# Patient Record
Sex: Female | Born: 1985
Health system: Southern US, Community
[De-identification: ages and names within clinical notes are randomized; demographics above are authoritative.]

## PROBLEM LIST (undated history)

## (undated) DIAGNOSIS — N289 Disorder of kidney and ureter, unspecified: Secondary | ICD-10-CM

## (undated) DIAGNOSIS — F909 Attention-deficit hyperactivity disorder, unspecified type: Secondary | ICD-10-CM

## (undated) HISTORY — PX: TONSILLECTOMY: SUR1361

---

## 2007-10-03 ENCOUNTER — Emergency Department (HOSPITAL_COMMUNITY): Admission: EM | Admit: 2007-10-03 | Discharge: 2007-10-03 | Payer: Self-pay | Admitting: Emergency Medicine

## 2008-08-05 ENCOUNTER — Emergency Department (HOSPITAL_COMMUNITY): Admission: EM | Admit: 2008-08-05 | Discharge: 2008-08-06 | Payer: Self-pay | Admitting: Emergency Medicine

## 2010-04-17 LAB — URINE CULTURE

## 2010-04-17 LAB — DIFFERENTIAL
Eosinophils Relative: 2 % (ref 0–5)
Lymphocytes Relative: 38 % (ref 12–46)
Monocytes Absolute: 0.8 10*3/uL (ref 0.1–1.0)
Monocytes Relative: 7 % (ref 3–12)

## 2010-04-17 LAB — CBC
HCT: 38.3 % (ref 36.0–46.0)
Hemoglobin: 13.3 g/dL (ref 12.0–15.0)
MCHC: 34.7 g/dL (ref 30.0–36.0)
RBC: 4.22 MIL/uL (ref 3.87–5.11)
RDW: 13.1 % (ref 11.5–15.5)

## 2010-04-17 LAB — URINALYSIS, ROUTINE W REFLEX MICROSCOPIC
Glucose, UA: 100 mg/dL — AB
Hgb urine dipstick: NEGATIVE
Ketones, ur: NEGATIVE mg/dL
Protein, ur: NEGATIVE mg/dL

## 2010-04-17 LAB — BASIC METABOLIC PANEL
CO2: 29 mEq/L (ref 19–32)
GFR calc Af Amer: >60 mL/min (ref >60)
GFR calc non Af Amer: 53 mL/min — ABNORMAL LOW (ref >60)
Glucose, Bld: 76 mg/dL (ref 70–99)
Potassium: 4 mEq/L (ref 3.5–5.1)
Sodium: 140 mEq/L (ref 135–145)

## 2014-07-04 ENCOUNTER — Emergency Department (HOSPITAL_COMMUNITY): Payer: PRIVATE HEALTH INSURANCE

## 2014-07-04 ENCOUNTER — Encounter (HOSPITAL_COMMUNITY): Payer: Self-pay | Admitting: Emergency Medicine

## 2014-07-04 ENCOUNTER — Emergency Department (HOSPITAL_COMMUNITY)
Admission: EM | Admit: 2014-07-04 | Discharge: 2014-07-04 | Disposition: A | Discharge status: Home / Self Care | Payer: PRIVATE HEALTH INSURANCE | Attending: Emergency Medicine | Admitting: Emergency Medicine

## 2014-07-04 DIAGNOSIS — S0993XA Unspecified injury of face, initial encounter: Secondary | ICD-10-CM | POA: Diagnosis present

## 2014-07-04 DIAGNOSIS — Z880 Allergy status to penicillin: Secondary | ICD-10-CM | POA: Insufficient documentation

## 2014-07-04 DIAGNOSIS — Z8659 Personal history of other mental and behavioral disorders: Secondary | ICD-10-CM | POA: Insufficient documentation

## 2014-07-04 DIAGNOSIS — S0033XA Contusion of nose, initial encounter: Secondary | ICD-10-CM | POA: Diagnosis not present

## 2014-07-04 DIAGNOSIS — W51XXXA Accidental striking against or bumped into by another person, initial encounter: Secondary | ICD-10-CM | POA: Insufficient documentation

## 2014-07-04 DIAGNOSIS — Y9389 Activity, other specified: Secondary | ICD-10-CM | POA: Diagnosis not present

## 2014-07-04 DIAGNOSIS — Y990 Civilian activity done for income or pay: Secondary | ICD-10-CM | POA: Insufficient documentation

## 2014-07-04 DIAGNOSIS — Z87448 Personal history of other diseases of urinary system: Secondary | ICD-10-CM | POA: Insufficient documentation

## 2014-07-04 DIAGNOSIS — Y9289 Other specified places as the place of occurrence of the external cause: Secondary | ICD-10-CM | POA: Diagnosis not present

## 2014-07-04 DIAGNOSIS — Z720 Tobacco use: Secondary | ICD-10-CM | POA: Insufficient documentation

## 2014-07-04 HISTORY — DX: Disorder of kidney and ureter, unspecified: N28.9

## 2014-07-04 HISTORY — DX: Attention-deficit hyperactivity disorder, unspecified type: F90.9

## 2014-07-04 MED ORDER — TRAMADOL HCL 50 MG PO TABS: 50.0000 mg | ORAL_TABLET | Freq: Four times a day (QID) | ORAL | Status: DC | PRN

## 2014-07-04 NOTE — ED Provider Notes (Signed)
CSN: [id number]     Arrival date & time 07/04/14  1421 History   First MD Initiated Contact with Patient 07/04/14 1450     Chief Complaint  Patient presents with  . Facial Injury     (Consider location/radiation/quality/duration/timing/severity/associated sxs/prior Treatment) The history is provided by the patient.   LORILEI BUFORD is a 29 y.o. female presenting for evaluation of nose injury.  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  She has had persistent aching pain despite ice packs (which has improved the swelling) and endorses transient nasal congestion with forward flexion but also states this may be her allergies causing this.  She has had no nose bleeds and denies any radiation of pain, no dizziness, bruising,  vision changes or other complaint.     Past Medical History  Diagnosis Date  . Renal disorder   . ADHD (attention deficit hyperactivity disorder)    Past Surgical History  Procedure Laterality Date  . Tonsillectomy     No family history on file. History  Substance Use Topics  . Smoking status: Current Some Day Smoker  . Smokeless tobacco: Not on file  . Alcohol Use: Yes     Comment: 2 beers per month   OB History    No data available     Review of Systems  Constitutional: Negative for fever and chills.  HENT: Positive for facial swelling. Negative for nosebleeds, postnasal drip, rhinorrhea and sore throat.   Eyes: Negative.   Respiratory: Negative.   Cardiovascular: Negative.   Gastrointestinal: Negative for nausea and vomiting.  Genitourinary: Negative.   Musculoskeletal: Negative for neck pain.  Skin: Negative.  Negative for rash and wound.  Neurological: Negative for dizziness, weakness, light-headedness, numbness and headaches.  Psychiatric/Behavioral: Negative.       Allergies  Biaxin; Penicillins; and Zyrtec  Home Medications    Prior to Admission medications   Medication Sig Start Date End Date Taking? Authorizing Provider  traMADol (ULTRAM) 50 MG tablet Take 1 tablet (50 mg total) by mouth every 6 (six) hours as needed. 07/04/14   Burgess Amor, PA-C   BP 115/91 mmHg  Pulse 98  Temp(Src) 98.4 F (36.9 C) (Oral)  Resp 18  Ht 5\' 1"  (1.549 m)  Wt 130 lb (58.968 kg)  BMI 24.58 kg/m2  SpO2 100%  LMP 06/13/2014 Physical Exam  Constitutional: She appears well-developed and well-nourished. No distress.  HENT:  Head: Normocephalic and atraumatic.  Right Ear: Hearing and external ear normal.  Left Ear: Hearing and external ear normal.  Nose: Sinus tenderness present. No mucosal edema, rhinorrhea, nasal deformity, septal deviation or nasal septal hematoma. No epistaxis.  ttp across nasal bridge, no deformity or edema.  Eyes: Conjunctivae are normal.  Neck: Normal range of motion.  Cardiovascular: Normal rate, regular rhythm, normal heart sounds and intact distal pulses.   Pulmonary/Chest: Effort normal and breath sounds normal. She has no wheezes.  Abdominal: Soft. Bowel sounds are normal. There is no tenderness.  Musculoskeletal: Normal range of motion.  Neurological: She is alert.  Skin: Skin is warm and dry.  Psychiatric: She has a normal mood and affect.  Nursing note and vitals reviewed.   ED Course  Procedures (including critical care time) Labs Review Labs Reviewed - No data to display  Imaging Review Dg Nasal Bones  07/04/2014   CLINICAL DATA:  elbowed by patient yesterday. Pain  in swelling at bridge of nose.  EXAM: NASAL BONES - 3+ VIEW  COMPARISON:  None.  FINDINGS: There is no evidence of fracture or other bone abnormality.  IMPRESSION: Negative.   Electronically Signed   By: Signa Kell M.D.   On: 07/04/2014 15:20     EKG Interpretation None      MDM   Final diagnoses:  Nasal contusion, initial encounter    Patients labs and/or radiological studies were reviewed and considered  during the medical decision making and disposition process.  Results were also discussed with patient.  Pt was given reassurance that symptoms should improve with time.  May continue using ice therapy.  She was prescribed tramadol if needed for additional pain relief.  She endorses a history of colitis so NSAIDs are not  recommended. when necessary follow-up if symptoms persist beyond the next week.    Burgess Amor, PA-C 07/04/14 1629  Eber Hong, MD 07/04/14 [phone]

## 2014-07-04 NOTE — Discharge Instructions (Signed)
Contusion °A contusion is a deep bruise. Contusions are the result of an injury that caused bleeding under the skin. The contusion may turn blue, purple, or yellow. Minor injuries will give you a painless contusion, but more severe contusions may stay painful and swollen for a few weeks.  °CAUSES  °A contusion is usually caused by a blow, trauma, or direct force to an area of the body. °SYMPTOMS  °· Swelling and redness of the injured area. °· Bruising of the injured area. °· Tenderness and soreness of the injured area. °· Pain. °DIAGNOSIS  °The diagnosis can be made by taking a history and physical exam. An X-ray, CT scan, or MRI may be needed to determine if there were any associated injuries, such as fractures. °TREATMENT  °Specific treatment will depend on what area of the body was injured. In general, the best treatment for a contusion is resting, icing, elevating, and applying cold compresses to the injured area. Over-the-counter medicines may also be recommended for pain control. Ask your caregiver what the best treatment is for your contusion. °HOME CARE INSTRUCTIONS  °· Put ice on the injured area. °¨ Put ice in a plastic bag. °¨ Place a towel between your skin and the bag. °¨ Leave the ice on for 15-20 minutes, 3-4 times a day, or as directed by your health care provider. °· Only take over-the-counter or prescription medicines for pain, discomfort, or fever as directed by your caregiver. Your caregiver may recommend avoiding anti-inflammatory medicines (aspirin, ibuprofen, and naproxen) for 48 hours because these medicines may increase bruising. °· Rest the injured area. °· If possible, elevate the injured area to reduce swelling. °SEEK IMMEDIATE MEDICAL CARE IF:  °· You have increased bruising or swelling. °· You have pain that is getting worse. °· Your swelling or pain is not relieved with medicines. °MAKE SURE YOU:  °· Understand these instructions. °· Will watch your condition. °· Will get help right  away if you are not doing well or get worse. °Document Released: 10/05/2004 Document Revised: 12/31/2012 Document Reviewed: 10/31/2010 °ExitCare® Patient Information ©2015 [employer]. This information is not intended to replace advice given to you by your health care provider. Make sure you discuss any questions you have with your health care provider. ° °

## 2014-07-04 NOTE — ED Notes (Signed)
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 Denies any bleeding but reports pain. Patient is here under workers comp.

## 2015-04-12 DIAGNOSIS — F908 Attention-deficit hyperactivity disorder, other type: Secondary | ICD-10-CM | POA: Diagnosis not present

## 2015-04-12 DIAGNOSIS — B373 Candidiasis of vulva and vagina: Secondary | ICD-10-CM | POA: Diagnosis not present

## 2015-04-12 DIAGNOSIS — J019 Acute sinusitis, unspecified: Secondary | ICD-10-CM | POA: Diagnosis not present

## 2015-04-12 DIAGNOSIS — R079 Chest pain, unspecified: Secondary | ICD-10-CM | POA: Diagnosis not present

## 2015-04-27 DIAGNOSIS — E663 Overweight: Secondary | ICD-10-CM | POA: Diagnosis not present

## 2015-04-27 DIAGNOSIS — Z01419 Encounter for gynecological examination (general) (routine) without abnormal findings: Secondary | ICD-10-CM | POA: Diagnosis not present

## 2015-04-27 DIAGNOSIS — R5383 Other fatigue: Secondary | ICD-10-CM | POA: Diagnosis not present

## 2015-04-27 DIAGNOSIS — Z789 Other specified health status: Secondary | ICD-10-CM | POA: Diagnosis not present

## 2015-05-28 DIAGNOSIS — R5383 Other fatigue: Secondary | ICD-10-CM | POA: Diagnosis not present

## 2015-06-03 DIAGNOSIS — R5383 Other fatigue: Secondary | ICD-10-CM | POA: Diagnosis not present

## 2015-06-10 DIAGNOSIS — R5383 Other fatigue: Secondary | ICD-10-CM | POA: Diagnosis not present

## 2015-06-20 ENCOUNTER — Emergency Department (HOSPITAL_COMMUNITY)
Admission: EM | Admit: 2015-06-20 | Discharge: 2015-06-20 | Disposition: A | Discharge status: Home / Self Care | Payer: 59 | Attending: Emergency Medicine | Admitting: Emergency Medicine

## 2015-06-20 ENCOUNTER — Emergency Department (HOSPITAL_COMMUNITY): Payer: 59

## 2015-06-20 ENCOUNTER — Encounter (HOSPITAL_COMMUNITY): Payer: Self-pay | Admitting: *Deleted

## 2015-06-20 DIAGNOSIS — Z79899 Other long term (current) drug therapy: Secondary | ICD-10-CM | POA: Diagnosis not present

## 2015-06-20 DIAGNOSIS — Z791 Long term (current) use of non-steroidal anti-inflammatories (NSAID): Secondary | ICD-10-CM | POA: Diagnosis not present

## 2015-06-20 DIAGNOSIS — F909 Attention-deficit hyperactivity disorder, unspecified type: Secondary | ICD-10-CM | POA: Insufficient documentation

## 2015-06-20 DIAGNOSIS — F17200 Nicotine dependence, unspecified, uncomplicated: Secondary | ICD-10-CM | POA: Insufficient documentation

## 2015-06-20 DIAGNOSIS — M25511 Pain in right shoulder: Secondary | ICD-10-CM | POA: Diagnosis not present

## 2015-06-20 DIAGNOSIS — R918 Other nonspecific abnormal finding of lung field: Secondary | ICD-10-CM | POA: Diagnosis not present

## 2015-06-20 LAB — CBC WITH DIFFERENTIAL/PLATELET
BASOS ABS: 0 10*3/uL (ref 0.0–0.1)
Basophils Relative: 1 %
EOS ABS: 0.2 10*3/uL (ref 0.0–0.7)
EOS PCT: 2 %
HCT: 42.2 % (ref 36.0–46.0)
Hemoglobin: 13.8 g/dL (ref 12.0–15.0)
LYMPHS PCT: 30 %
Lymphs Abs: 2.3 10*3/uL (ref 0.7–4.0)
MCH: 31.2 pg (ref 26.0–34.0)
MCHC: 32.7 g/dL (ref 30.0–36.0)
MCV: 95.3 fL (ref 78.0–100.0)
MONO ABS: 0.6 10*3/uL (ref 0.1–1.0)
Monocytes Relative: 8 %
Neutro Abs: 4.7 10*3/uL (ref 1.7–7.7)
Neutrophils Relative %: 59 %
PLATELETS: 288 10*3/uL (ref 150–400)
RBC: 4.43 MIL/uL (ref 3.87–5.11)
RDW: 13.3 % (ref 11.5–15.5)
WBC: 7.8 10*3/uL (ref 4.0–10.5)

## 2015-06-20 NOTE — ED Notes (Signed)
Pt c/o right "shoulder joint" pain x 2 days that radiates down into her elbow and worsens when she tries to lift her upper arm. Pt has been receiving Vitamin B12 shots and started having this pain after her injection on Friday. Pt reports she had immediate pain at the site when she was injected Friday. Pt does report tingling sensation in all extremities due to low Vitamin B12 for 2-3 months. Pt denies any injury to the shoulder.

## 2015-06-20 NOTE — ED Provider Notes (Signed)
CSN: [id number]     Arrival date & time 06/20/15  1423 History   First MD Initiated Contact with Patient 06/20/15 1451     Chief Complaint  Patient presents with  . Shoulder Pain     (Consider location/radiation/quality/duration/timing/severity/associated sxs/prior Treatment) Patient is a 30 y.o. female presenting with shoulder pain.  Shoulder Pain  30 year old female comes today complaining of right arm pain. She states that she received a B-12 injection in her right shoulder for 2 days ago. She has had these in the past. This time she had immediate pain in the site and she was injected. She has had ongoing tenderness in the lateral aspect of her right shoulder. There are some increased pain with movement. She has not noted any redness, warmth, or fever. Past Medical History  Diagnosis Date  . ADHD (attention deficit hyperactivity disorder)   . 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 Tonsillectomy     No family history on file. Social History  Substance Use Topics  . Smoking status: Current Some Day Smoker  . Smokeless tobacco: None  . Alcohol Use: Yes     Comment: 2 beers per month   OB History    No data available     Review of Systems  All other systems reviewed and are negative.     Allergies  Biaxin; Penicillins; and Zyrtec  Home Medications   Prior to Admission medications   Medication Sig Start Date End Date Taking? Authorizing Provider  amphetamine-dextroamphetamine (ADDERALL) 10 MG tablet Take 10 mg by mouth 2 (two) times daily with a meal.   Yes Historical Provider, MD  Cyanocobalamin (B-12 COMPLIANCE INJECTION) 1000 MCG/ML KIT Inject 1,000 mcg as directed once a week.   Yes Historical Provider, MD  ibuprofen (ADVIL,MOTRIN) 200 MG tablet Take 400 mg by mouth every 6 (six) hours as needed for moderate pain.   Yes Historical Provider, MD  Norgestimate-Ethinyl Estradiol Triphasic  0.18/0.215/0.25 MG-25 MCG tab Take 1 tablet by mouth daily.   Yes Historical Provider, MD  traMADol (ULTRAM) 50 MG tablet Take 1 tablet (50 mg total) by mouth every 6 (six) hours as needed. Patient not taking: Reported on 06/20/2015 07/04/14   Evalee Jefferson, PA-C   BP 130/95 mmHg  Pulse 106  Temp(Src) 98.2 F (36.8 C) (Oral)  Resp 18  Ht 5' 1"  (1.549 m)  Wt 59.421 kg  BMI 24.76 kg/m2  SpO2 100%  LMP 06/17/2015 Physical Exam  Constitutional: She is oriented to person, place, and time. She appears well-developed and well-nourished. No distress.  HENT:  Head: Normocephalic and atraumatic.  Right Ear: Tympanic membrane and external ear normal.  Left Ear: Tympanic membrane and external ear normal.  Nose: Nose normal. Right sinus exhibits no maxillary sinus tenderness and no frontal sinus tenderness. Left sinus exhibits no maxillary sinus tenderness and no frontal sinus tenderness.  Eyes: Conjunctivae and EOM are normal. Pupils are equal, round, and reactive to light. Right eye exhibits no nystagmus. Left eye exhibits no nystagmus.  Neck: Normal range of motion. Neck supple.  Cardiovascular: Normal rate, regular rhythm, normal heart sounds and intact distal pulses.   Pulmonary/Chest: Effort normal and breath sounds normal. No respiratory distress. She exhibits no tenderness.  Abdominal: Soft. Bowel sounds are normal. She exhibits no distension and no mass. There is no tenderness.  Musculoskeletal: Normal range of motion. She exhibits no edema or  tenderness.       Arms: Pain increases with abduction and with external rotation. No redness, warmth, or swelling noted. Radial pulses are intact.  Neurological: She is alert and oriented to person, place, and time. She has normal strength and normal reflexes. No sensory deficit. She exhibits normal muscle tone. She displays a negative Romberg sign. Coordination normal. GCS eye subscore is 4. GCS verbal subscore is 5. GCS motor subscore is 6.  Skin: Skin  is warm and dry. No rash noted.  Psychiatric: She has a normal mood and affect. Her behavior is normal. Judgment and thought content normal.  Nursing note and vitals reviewed.   ED Course  Procedures (including critical care time) Labs Review Labs Reviewed  CBC WITH DIFFERENTIAL/PLATELET    Imaging Review No results found. I have personally reviewed and evaluated these images and lab results as part of my medical decision-making.   EKG Interpretation None      MDM   Final diagnoses:  Right shoulder pain    Right shoulder pain after injection.  No s/s infection or local reaction.  Patient advised regarding return precautions and need for follow up.   ? Nodule seen and cxr without nodule.   Pattricia Boss, MD 06/20/15 [phone]

## 2015-06-20 NOTE — ED Notes (Signed)
Pt says she is not able to raise right arm with severe pain.  Rates pain 9/10 when doing ADLs.  Pt states she was given im injection to right arm on Friday by a friend Banker(RN).  Pt says felt pain after injection.

## 2015-06-20 NOTE — Discharge Instructions (Signed)
Shoulder Pain The shoulder is the joint that connects your arm to your body. Muscles and band-like tissues that connect bones to muscles (tendons) hold the joint together. Shoulder pain is felt if an injury or medical problem affects one or more parts of the shoulder. HOME CARE   Put ice on the sore area.  Put ice in a plastic bag.  Place a towel between your skin and the bag.  Leave the ice on for 15-20 minutes, 03-04 times a day for the first 2 days.  Stop using cold packs if they do not help with the pain.  If you were given something to keep your shoulder from moving (sling; shoulder immobilizer), wear it as told. Only take it off to shower or bathe.  Move your arm as little as possible, but keep your hand moving to prevent puffiness (swelling).  Squeeze a soft ball or foam pad as much as possible to help prevent swelling.  Take medicine as told by your doctor. GET HELP IF:  You have progressing new pain in your arm, hand, or fingers.  Your hand or fingers get cold.  Your medicine does not help lessen your pain. GET HELP RIGHT AWAY IF:   Your arm, hand, or fingers are numb or tingling.  Your arm, hand, or fingers are puffy (swollen), painful, or turn white or blue. MAKE SURE YOU:   Understand these instructions.  Will watch your condition.  Will get help right away if you are not doing well or get worse.   This information is not intended to replace advice given to you by your health care provider. Make sure you discuss any questions you have with your health care provider.   Document Released: 06/14/2007 Document Revised: 01/16/2014 Document Reviewed: 04/20/2014 Elsevier Interactive Patient Education 2016 Else[employer] Inc.  

## 2015-07-08 DIAGNOSIS — R5383 Other fatigue: Secondary | ICD-10-CM | POA: Diagnosis not present

## 2015-08-05 DIAGNOSIS — R5383 Other fatigue: Secondary | ICD-10-CM | POA: Diagnosis not present

## 2015-08-26 DIAGNOSIS — R5383 Other fatigue: Secondary | ICD-10-CM | POA: Diagnosis not present

## 2015-09-09 DIAGNOSIS — R5383 Other fatigue: Secondary | ICD-10-CM | POA: Diagnosis not present

## 2015-09-23 DIAGNOSIS — R5383 Other fatigue: Secondary | ICD-10-CM | POA: Diagnosis not present

## 2015-10-06 DIAGNOSIS — R5383 Other fatigue: Secondary | ICD-10-CM | POA: Diagnosis not present

## 2015-10-27 DIAGNOSIS — R5383 Other fatigue: Secondary | ICD-10-CM | POA: Diagnosis not present

## 2015-11-03 DIAGNOSIS — F908 Attention-deficit hyperactivity disorder, other type: Secondary | ICD-10-CM | POA: Diagnosis not present

## 2015-11-03 DIAGNOSIS — D510 Vitamin B12 deficiency anemia due to intrinsic factor deficiency: Secondary | ICD-10-CM | POA: Diagnosis not present

## 2015-11-03 DIAGNOSIS — L02439 Carbuncle of limb, unspecified: Secondary | ICD-10-CM | POA: Diagnosis not present

## 2015-11-03 DIAGNOSIS — R5383 Other fatigue: Secondary | ICD-10-CM | POA: Diagnosis not present

## 2015-11-25 DIAGNOSIS — R5383 Other fatigue: Secondary | ICD-10-CM | POA: Diagnosis not present

## 2015-12-10 DIAGNOSIS — M79621 Pain in right upper arm: Secondary | ICD-10-CM | POA: Diagnosis not present

## 2015-12-10 DIAGNOSIS — L089 Local infection of the skin and subcutaneous tissue, unspecified: Secondary | ICD-10-CM | POA: Diagnosis not present

## 2015-12-10 DIAGNOSIS — L02411 Cutaneous abscess of right axilla: Secondary | ICD-10-CM | POA: Diagnosis not present

## 2015-12-29 ENCOUNTER — Encounter (HOSPITAL_COMMUNITY): Payer: Self-pay | Admitting: Emergency Medicine

## 2015-12-29 ENCOUNTER — Emergency Department (HOSPITAL_COMMUNITY)
Admission: EM | Admit: 2015-12-29 | Discharge: 2015-12-29 | Disposition: A | Discharge status: Home / Self Care | Payer: 59 | Attending: Emergency Medicine | Admitting: Emergency Medicine

## 2015-12-29 DIAGNOSIS — Z79899 Other long term (current) drug therapy: Secondary | ICD-10-CM | POA: Insufficient documentation

## 2015-12-29 DIAGNOSIS — Z2914 Encounter for prophylactic rabies immune globin: Secondary | ICD-10-CM | POA: Diagnosis not present

## 2015-12-29 DIAGNOSIS — F909 Attention-deficit hyperactivity disorder, unspecified type: Secondary | ICD-10-CM | POA: Diagnosis not present

## 2015-12-29 DIAGNOSIS — Z209 Contact with and (suspected) exposure to unspecified communicable disease: Secondary | ICD-10-CM

## 2015-12-29 DIAGNOSIS — F17200 Nicotine dependence, unspecified, uncomplicated: Secondary | ICD-10-CM | POA: Insufficient documentation

## 2015-12-29 DIAGNOSIS — Z203 Contact with and (suspected) exposure to rabies: Secondary | ICD-10-CM | POA: Insufficient documentation

## 2015-12-29 DIAGNOSIS — Z791 Long term (current) use of non-steroidal anti-inflammatories (NSAID): Secondary | ICD-10-CM | POA: Diagnosis not present

## 2015-12-29 MED ORDER — RABIES VACCINE, PCEC IM SUSR
1.0000 mL | Freq: Once | INTRAMUSCULAR | Status: AC
  Administered 2015-12-29: 1 mL via INTRAMUSCULAR
  Filled 2015-12-29: qty 1

## 2015-12-29 MED ORDER — RABIES IMMUNE GLOBULIN 150 UNIT/ML IM INJ
INJECTION | INTRAMUSCULAR | Status: AC
  Filled 2015-12-29: qty 10

## 2015-12-29 MED ORDER — ACETAMINOPHEN 325 MG PO TABS
650.0000 mg | ORAL_TABLET | Freq: Once | ORAL | Status: AC
  Administered 2015-12-29: 650 mg via ORAL
  Filled 2015-12-29: qty 2

## 2015-12-29 MED ORDER — RABIES IMMUNE GLOBULIN 150 UNIT/ML IM INJ
20.0000 [IU]/kg | INJECTION | Freq: Once | INTRAMUSCULAR | Status: AC
  Administered 2015-12-29: 1275 [IU] via INTRAMUSCULAR
  Filled 2015-12-29: qty 8.5

## 2015-12-29 NOTE — ED Triage Notes (Signed)
Pt exposed to a bat in her appt. Per pt the [facility] sent pt for post rabies exposure vaccines.

## 2015-12-29 NOTE — Discharge Instructions (Signed)
Someone from the speciality clinic should contact you to arrange a follow-up appt for your remaining vaccines.

## 2016-01-02 NOTE — ED Provider Notes (Signed)
[facility] DEPT Provider Note   CSN: [id number] Arrival date & time: 12/29/15  1824     History   Chief Complaint Chief Complaint  Patient presents with  . Bat exposure    HPI Brenda Thompson is a 30 y.o. female.  HPI   Brenda Thompson is a 30 y.o. female who presents to the Emergency Department requesting rabies vaccines after finding a bat in her bedroom.   She states she is unsure how long the bat was in her room.  Pest control removed the bat and checked her home.  No other bats found.  She denies symptoms at present, but was told by health dept to get rabies vaccines.  Denies known bite.  Td is up to date     Past Medical History:  Diagnosis Date  . ADHD (attention deficit hyperactivity disorder)   . 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   Past Surgical History:  Procedure Laterality Date  . TONSILLECTOMY      OB History    Gravida Para Term Preterm AB Living             0   SAB TAB Ectopic Multiple Live Births                   Home Medications    Prior to Admission medications   Medication Sig Start Date End Date Taking? Authorizing Provider  amphetamine-dextroamphetamine (ADDERALL) 10 MG tablet Take 10 mg by mouth 2 (two) times daily with a meal.    Historical Provider, MD  Cyanocobalamin (B-12 COMPLIANCE INJECTION) 1000 MCG/ML KIT Inject 1,000 mcg as directed once a week.    Historical Provider, MD  ibuprofen (ADVIL,MOTRIN) 200 MG tablet Take 400 mg by mouth every 6 (six) hours as needed for moderate pain.    Historical Provider, MD  Norgestimate-Ethinyl Estradiol Triphasic 0.18/0.215/0.25 MG-25 MCG tab Take 1 tablet by mouth daily.    Historical Provider, MD  traMADol (ULTRAM) 50 MG tablet Take 1 tablet (50 mg total) by mouth every 6 (six) hours as needed. Patient not taking: Reported on 06/20/2015 07/04/14   Evalee Jefferson, PA-C    Family History Family History    Problem Relation Age of Onset  . Pancreatic cancer Father     Social History Social History  Substance Use Topics  . Smoking status: Current Some Day Smoker  . Smokeless tobacco: Never Used  . Alcohol use Yes     Comment: 2 beers per month     Allergies   Biaxin [clarithromycin]; Penicillins; and Zyrtec [cetirizine]   Review of Systems Review of Systems  Constitutional: Negative.   HENT: Negative for ear pain and sore throat.   Eyes: Negative.   Respiratory: Negative for cough and shortness of breath.   Cardiovascular: Negative for chest pain.  Gastrointestinal: Negative for abdominal pain, nausea and vomiting.  Genitourinary: Negative for dysuria, frequency and hematuria.  Musculoskeletal: Negative for back pain and neck pain.  Skin: Negative for rash.  Neurological: Negative for dizziness and headaches.  Hematological: Does not bruise/bleed easily.  Psychiatric/Behavioral: The patient is not nervous/anxious.      Physical Exam Updated Vital Signs BP 120/89   Pulse 80   Temp 98.1 F (36.7 C) (Oral)   Resp 20   Ht _0  (1.549 m)   Wt 64.1 kg   LMP 12/08/2015  SpO2 100%   BMI 26.69 kg/m   Physical Exam  Constitutional: She is oriented to person, place, and time.  HENT:  Head: Normocephalic.  Eyes: Pupils are equal, round, and reactive to light.  Neck: Normal range of motion. Neck supple. No Kernig's sign noted. No thyromegaly present.  Cardiovascular: Normal rate and normal heart sounds.   Pulmonary/Chest: Effort normal and breath sounds normal. She has no wheezes.  Abdominal: Soft. Normal appearance. There is no tenderness. There is no rebound and no guarding.  Musculoskeletal: Normal range of motion.  Neurological: She is alert and oriented to person, place, and time.  Skin: Skin is warm and dry. No rash noted.     ED Treatments / Results  Labs (all labs ordered are listed, but only abnormal results are displayed) Labs Reviewed - No data to  display  EKG  EKG Interpretation None       Radiology No results found.  Procedures Procedures (including critical care time)  Medications Ordered in ED Medications  rabies immune globulin (HYPERAB) injection 1,275 Units (1,275 Units Intramuscular Given During Downtime 12/29/15 2131)  rabies vaccine (RABAVERT) injection 1 mL (1 mL Intramuscular Given During Downtime 12/29/15 2132)  acetaminophen (TYLENOL) tablet 650 mg (650 mg Oral Given 12/29/15 2203)     Initial Impression / Assessment and Plan / ED Course  I have reviewed the triage vital signs and the nursing notes.  Pertinent labs & imaging results that were available during my care of the patient were reviewed by me and considered in my medical decision making (see chart for details).  Clinical Course     Rabies protocol initiated here.  subsequent vaccines to be given by specialty clinic.  Pt understands plan and agrees to plan.    Observed, appears stable for d/c  Final Clinical Impressions(s) / ED Diagnoses   Final diagnoses:  Exposure to bat without known bite    New Prescriptions Discharge Medication List as of 12/29/2015  9:30 PM       Darlene Bartelt Vanessa [place], PA-C 01/02/16 1547    Noemi Chapel, MD 01/04/16 [phone]

## 2016-01-05 ENCOUNTER — Encounter (HOSPITAL_COMMUNITY): Payer: Self-pay

## 2016-01-05 ENCOUNTER — Encounter (HOSPITAL_COMMUNITY)
Admission: RE | Admit: 2016-01-05 | Discharge: 2016-01-05 | Disposition: A | Discharge status: Home / Self Care | Payer: 59 | Source: Ambulatory Visit | Attending: Emergency Medicine | Admitting: Emergency Medicine

## 2016-01-05 DIAGNOSIS — Z203 Contact with and (suspected) exposure to rabies: Secondary | ICD-10-CM | POA: Insufficient documentation

## 2016-01-05 DIAGNOSIS — T7589XA Other specified effects of external causes, initial encounter: Secondary | ICD-10-CM | POA: Diagnosis not present

## 2016-01-05 MED ORDER — RABIES VACCINE, PCEC IM SUSR
1.0000 mL | Freq: Once | INTRAMUSCULAR | Status: AC
  Administered 2016-01-05: 1 mL via INTRAMUSCULAR
  Filled 2016-01-05: qty 1

## 2016-01-05 NOTE — Discharge Instructions (Signed)
Rabies Immune Globulin, human RIG solution for injection °What is this medicine? °RABIES IMMUNE GLOBULIN (ray BEES im MYOON GLOB yoo lin) is used to prevent rabies infection. Rabies is mostly a disease of animals. Humans may get rabies if they are bitten by animals that have rabies. This medicine is given to someone after they have been exposed. °COMMON BRAND NAME(S): BayRab, HyperRAB S/D, Imogam °What should I tell my health care provider before I take this medicine? °They need to know if you have any of these conditions: °-bleeding disorder °-IgA deficiency °-recently received or scheduled to receive a vaccine °-take medicines that treat or prevent blood clots °-an unusual or allergic reaction to immune globulin, other medicines, foods, dyes, or preservatives °-pregnant or trying to get pregnant °-breast-feeding °How should I use this medicine? °This medicine is for injection into the area around a wound or into a muscle. It is given by a health care professional in a hospital or clinic setting. °A copy of Vaccine Information Statements will be given. Read this sheet carefully each time. The sheet may change frequently. °Talk to your pediatrician regarding the use of this medicine in children. While this drug may be prescribed for children and infants, precautions do apply. °What if I miss a dose? °This does not apply. °What may interact with this medicine? °-Live virus vaccines °What should I watch for while using this medicine? °This medicine can decrease the response to a vaccine. If you need to get vaccinated, tell your healthcare professional if you have received this medicine within the last 4 months. Extra booster doses may be needed. Talk to your doctor to see if a different vaccination schedule is needed. °This medicine contains products from human blood. It may be possible to pass an infection in this medicine, but no cases have been reported. Talk to your doctor about the risks and benefits of this  medicine. °Your condition will be monitored carefully while you are receiving this medicine. °What side effects may I notice from receiving this medicine? °Side effects that you should report to your doctor or health care professional as soon as possible: °-allergic reactions like skin rash, itching or hives, swelling of the face, lips, or tongue °Side effects that usually do not require medical attention (report to your doctor or health care professional if they continue or are bothersome): °-fever °-headache °-pain, redness, swelling, or irritation at site where injected °Where should I keep my medicine? °This drug is given in a hospital or clinic and will not be stored at home. °© 2017 Elsevier/Gold Standard (2015-01-28 08:36:25) ° °

## 2016-01-12 ENCOUNTER — Ambulatory Visit (HOSPITAL_COMMUNITY): Payer: Self-pay

## 2016-01-12 ENCOUNTER — Encounter (HOSPITAL_COMMUNITY)
Admission: RE | Admit: 2016-01-12 | Discharge: 2016-01-12 | Disposition: A | Discharge status: Home / Self Care | Payer: 59 | Source: Ambulatory Visit | Attending: Emergency Medicine | Admitting: Emergency Medicine

## 2016-01-12 DIAGNOSIS — T7589XA Other specified effects of external causes, initial encounter: Secondary | ICD-10-CM | POA: Diagnosis not present

## 2016-01-12 DIAGNOSIS — Z203 Contact with and (suspected) exposure to rabies: Secondary | ICD-10-CM | POA: Diagnosis not present

## 2016-01-12 MED ORDER — RABIES VACCINE, PCEC IM SUSR
1.0000 mL | Freq: Once | INTRAMUSCULAR | Status: AC
  Administered 2016-01-12: 1 mL via INTRAMUSCULAR

## 2016-01-12 MED ORDER — RABIES VACCINE, PCEC IM SUSR
INTRAMUSCULAR | Status: AC
  Filled 2016-01-12: qty 1

## 2016-02-10 DIAGNOSIS — E538 Deficiency of other specified B group vitamins: Secondary | ICD-10-CM | POA: Diagnosis not present

## 2016-03-02 DIAGNOSIS — E538 Deficiency of other specified B group vitamins: Secondary | ICD-10-CM | POA: Diagnosis not present

## 2016-04-06 DIAGNOSIS — E538 Deficiency of other specified B group vitamins: Secondary | ICD-10-CM | POA: Diagnosis not present

## 2016-04-27 DIAGNOSIS — J209 Acute bronchitis, unspecified: Secondary | ICD-10-CM | POA: Diagnosis not present

## 2016-04-27 DIAGNOSIS — J019 Acute sinusitis, unspecified: Secondary | ICD-10-CM | POA: Diagnosis not present

## 2016-05-08 DIAGNOSIS — Z01419 Encounter for gynecological examination (general) (routine) without abnormal findings: Secondary | ICD-10-CM | POA: Diagnosis not present

## 2016-05-08 DIAGNOSIS — E663 Overweight: Secondary | ICD-10-CM | POA: Diagnosis not present

## 2016-05-08 DIAGNOSIS — E559 Vitamin D deficiency, unspecified: Secondary | ICD-10-CM | POA: Diagnosis not present

## 2016-05-08 DIAGNOSIS — E538 Deficiency of other specified B group vitamins: Secondary | ICD-10-CM | POA: Diagnosis not present

## 2016-05-25 DIAGNOSIS — Z0000 Encounter for general adult medical examination without abnormal findings: Secondary | ICD-10-CM | POA: Diagnosis not present

## 2016-05-25 DIAGNOSIS — F908 Attention-deficit hyperactivity disorder, other type: Secondary | ICD-10-CM | POA: Diagnosis not present

## 2016-05-25 DIAGNOSIS — I872 Venous insufficiency (chronic) (peripheral): Secondary | ICD-10-CM | POA: Diagnosis not present

## 2016-05-25 DIAGNOSIS — R0989 Other specified symptoms and signs involving the circulatory and respiratory systems: Secondary | ICD-10-CM | POA: Diagnosis not present

## 2016-06-12 IMAGING — DX DG NASAL BONES 3+V
3 series · 3 of 3 positions shown · non-contrast
Comparison: None.

CLINICAL DATA: elbowed by patient yesterday. Pain in swelling at
bridge of nose.

EXAM:
NASAL BONES - 3+ VIEW

[nasal waters]
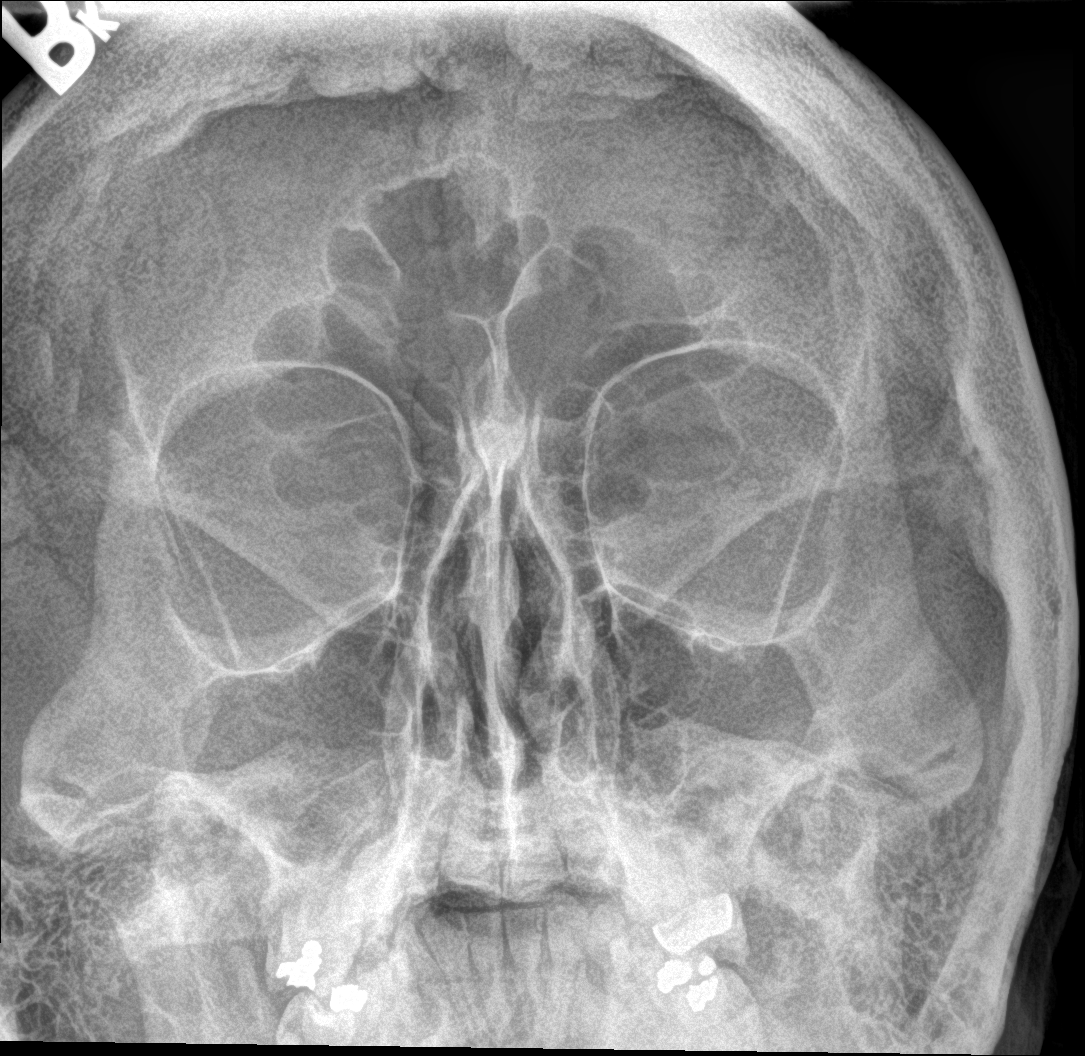

[nasal lat (1 of 2)]
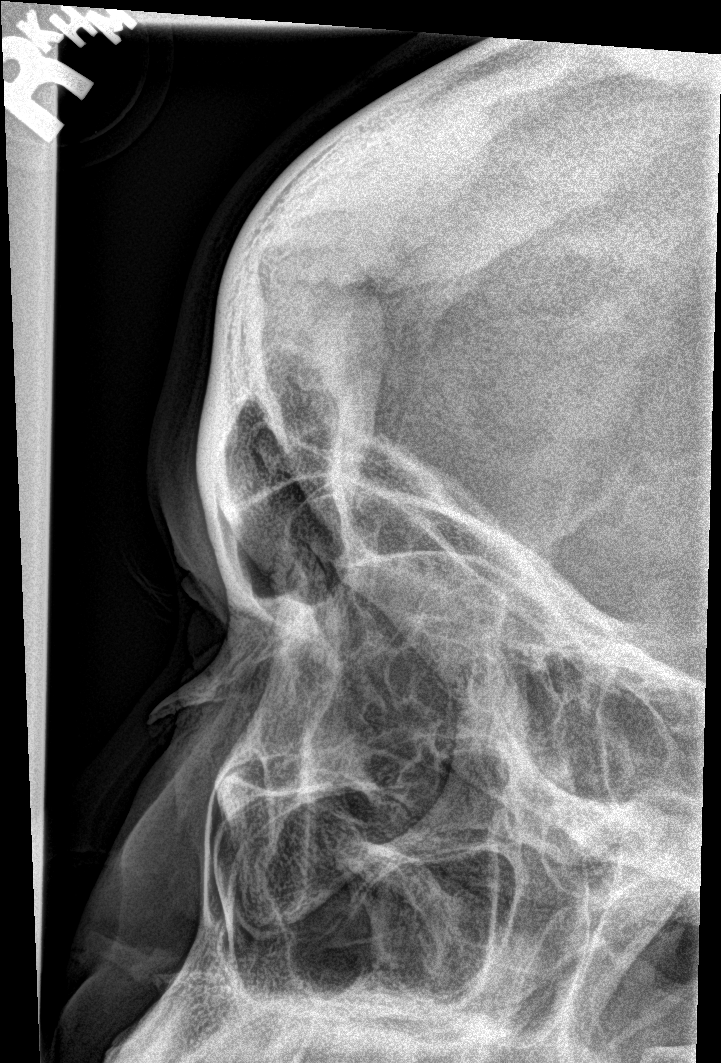

[nasal lat (2 of 2)]
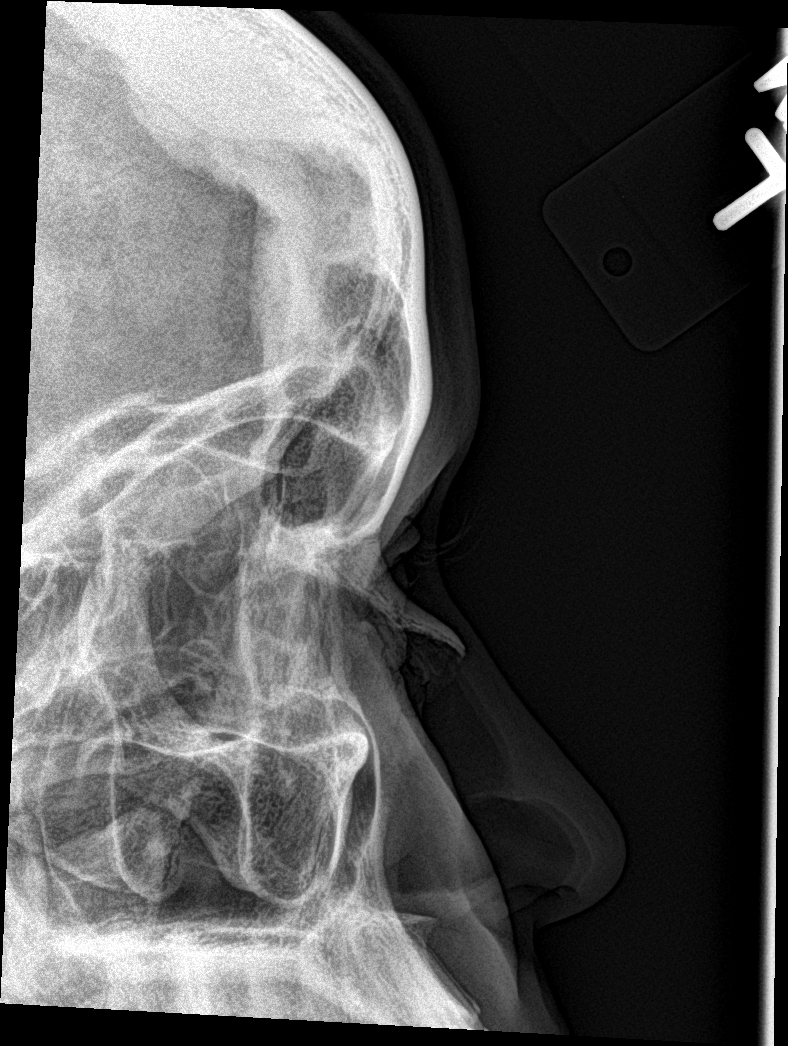

[3 of 3 positions shown; findings below may reference images not displayed]

FINDINGS: There is no evidence of fracture or other bone abnormality.
IMPRESSION: Negative.

## 2016-08-08 DIAGNOSIS — F909 Attention-deficit hyperactivity disorder, unspecified type: Secondary | ICD-10-CM | POA: Diagnosis not present

## 2016-08-08 DIAGNOSIS — E559 Vitamin D deficiency, unspecified: Secondary | ICD-10-CM | POA: Diagnosis not present

## 2016-08-08 DIAGNOSIS — E538 Deficiency of other specified B group vitamins: Secondary | ICD-10-CM | POA: Diagnosis not present

## 2016-08-17 DIAGNOSIS — F902 Attention-deficit hyperactivity disorder, combined type: Secondary | ICD-10-CM | POA: Diagnosis not present

## 2016-08-17 DIAGNOSIS — F4323 Adjustment disorder with mixed anxiety and depressed mood: Secondary | ICD-10-CM | POA: Diagnosis not present

## 2016-08-22 DIAGNOSIS — F902 Attention-deficit hyperactivity disorder, combined type: Secondary | ICD-10-CM | POA: Diagnosis not present

## 2016-08-22 DIAGNOSIS — F4323 Adjustment disorder with mixed anxiety and depressed mood: Secondary | ICD-10-CM | POA: Diagnosis not present

## 2016-08-23 DIAGNOSIS — F902 Attention-deficit hyperactivity disorder, combined type: Secondary | ICD-10-CM | POA: Diagnosis not present

## 2016-08-23 DIAGNOSIS — F4323 Adjustment disorder with mixed anxiety and depressed mood: Secondary | ICD-10-CM | POA: Diagnosis not present

## 2016-10-04 DIAGNOSIS — R7309 Other abnormal glucose: Secondary | ICD-10-CM | POA: Diagnosis not present

## 2016-10-04 DIAGNOSIS — F900 Attention-deficit hyperactivity disorder, predominantly inattentive type: Secondary | ICD-10-CM | POA: Diagnosis not present

## 2016-10-04 DIAGNOSIS — F329 Major depressive disorder, single episode, unspecified: Secondary | ICD-10-CM | POA: Diagnosis not present

## 2016-10-04 DIAGNOSIS — E559 Vitamin D deficiency, unspecified: Secondary | ICD-10-CM | POA: Diagnosis not present

## 2016-10-04 DIAGNOSIS — G4700 Insomnia, unspecified: Secondary | ICD-10-CM | POA: Diagnosis not present

## 2016-10-04 DIAGNOSIS — Z13220 Encounter for screening for lipoid disorders: Secondary | ICD-10-CM | POA: Diagnosis not present

## 2016-10-04 DIAGNOSIS — F909 Attention-deficit hyperactivity disorder, unspecified type: Secondary | ICD-10-CM | POA: Diagnosis not present

## 2016-10-04 DIAGNOSIS — F419 Anxiety disorder, unspecified: Secondary | ICD-10-CM | POA: Diagnosis not present

## 2016-10-04 DIAGNOSIS — E538 Deficiency of other specified B group vitamins: Secondary | ICD-10-CM | POA: Diagnosis not present

## 2016-11-29 DIAGNOSIS — L02416 Cutaneous abscess of left lower limb: Secondary | ICD-10-CM | POA: Diagnosis not present

## 2016-12-08 MED FILL — DEXTROAMP-AMP 10 MG TAB: 10 | 30 days supply | Qty: 60 | Fill #0

## 2016-12-28 DIAGNOSIS — F909 Attention-deficit hyperactivity disorder, unspecified type: Secondary | ICD-10-CM | POA: Diagnosis not present

## 2017-02-19 MED FILL — DEXTROAMP-AMP 10 MG TAB: 10 | 30 days supply | Qty: 60 | Fill #0

## 2017-05-07 MED FILL — DEXTROAMP-AMP 10 MG TAB: 10 | 30 days supply | Qty: 60 | Fill #0

## 2017-05-08 DIAGNOSIS — F419 Anxiety disorder, unspecified: Secondary | ICD-10-CM | POA: Diagnosis not present

## 2017-05-08 DIAGNOSIS — F909 Attention-deficit hyperactivity disorder, unspecified type: Secondary | ICD-10-CM | POA: Diagnosis not present

## 2017-05-08 DIAGNOSIS — Z5181 Encounter for therapeutic drug level monitoring: Secondary | ICD-10-CM | POA: Diagnosis not present

## 2017-05-08 DIAGNOSIS — J309 Allergic rhinitis, unspecified: Secondary | ICD-10-CM | POA: Diagnosis not present

## 2017-05-08 DIAGNOSIS — J019 Acute sinusitis, unspecified: Secondary | ICD-10-CM | POA: Diagnosis not present

## 2017-05-16 DIAGNOSIS — R05 Cough: Secondary | ICD-10-CM | POA: Diagnosis not present

## 2017-05-16 DIAGNOSIS — Z01419 Encounter for gynecological examination (general) (routine) without abnormal findings: Secondary | ICD-10-CM | POA: Diagnosis not present

## 2017-05-16 DIAGNOSIS — R062 Wheezing: Secondary | ICD-10-CM | POA: Diagnosis not present

## 2017-05-31 DIAGNOSIS — E538 Deficiency of other specified B group vitamins: Secondary | ICD-10-CM | POA: Diagnosis not present

## 2017-05-31 DIAGNOSIS — Z1329 Encounter for screening for other suspected endocrine disorder: Secondary | ICD-10-CM | POA: Diagnosis not present

## 2017-05-31 DIAGNOSIS — E559 Vitamin D deficiency, unspecified: Secondary | ICD-10-CM | POA: Diagnosis not present

## 2017-05-31 DIAGNOSIS — Z13220 Encounter for screening for lipoid disorders: Secondary | ICD-10-CM | POA: Diagnosis not present

## 2017-07-03 MED FILL — AMPHETAMINE-DEXTROAMPHETAMI: 10 | 30 days supply | Qty: 60 | Fill #0

## 2017-07-30 DIAGNOSIS — F329 Major depressive disorder, single episode, unspecified: Secondary | ICD-10-CM | POA: Diagnosis not present

## 2017-07-30 DIAGNOSIS — F419 Anxiety disorder, unspecified: Secondary | ICD-10-CM | POA: Diagnosis not present

## 2017-07-30 DIAGNOSIS — E559 Vitamin D deficiency, unspecified: Secondary | ICD-10-CM | POA: Diagnosis not present

## 2017-07-30 DIAGNOSIS — F909 Attention-deficit hyperactivity disorder, unspecified type: Secondary | ICD-10-CM | POA: Diagnosis not present

## 2017-07-30 DIAGNOSIS — E538 Deficiency of other specified B group vitamins: Secondary | ICD-10-CM | POA: Diagnosis not present

## 2017-07-30 DIAGNOSIS — J309 Allergic rhinitis, unspecified: Secondary | ICD-10-CM | POA: Diagnosis not present

## 2017-07-30 DIAGNOSIS — G4700 Insomnia, unspecified: Secondary | ICD-10-CM | POA: Diagnosis not present

## 2017-08-04 DIAGNOSIS — Y929 Unspecified place or not applicable: Secondary | ICD-10-CM | POA: Diagnosis not present

## 2017-08-04 DIAGNOSIS — Y9389 Activity, other specified: Secondary | ICD-10-CM | POA: Diagnosis not present

## 2017-08-04 DIAGNOSIS — F17200 Nicotine dependence, unspecified, uncomplicated: Secondary | ICD-10-CM | POA: Insufficient documentation

## 2017-08-04 DIAGNOSIS — Y998 Other external cause status: Secondary | ICD-10-CM | POA: Insufficient documentation

## 2017-08-04 DIAGNOSIS — S9031XA Contusion of right foot, initial encounter: Secondary | ICD-10-CM | POA: Insufficient documentation

## 2017-08-04 DIAGNOSIS — S99921A Unspecified injury of right foot, initial encounter: Secondary | ICD-10-CM | POA: Diagnosis present

## 2017-08-04 DIAGNOSIS — Z79899 Other long term (current) drug therapy: Secondary | ICD-10-CM | POA: Insufficient documentation

## 2017-08-04 DIAGNOSIS — W230XXA Caught, crushed, jammed, or pinched between moving objects, initial encounter: Secondary | ICD-10-CM | POA: Diagnosis not present

## 2017-08-05 ENCOUNTER — Emergency Department (HOSPITAL_COMMUNITY): Payer: PRIVATE HEALTH INSURANCE

## 2017-08-05 ENCOUNTER — Encounter (HOSPITAL_COMMUNITY): Payer: Self-pay | Admitting: *Deleted

## 2017-08-05 ENCOUNTER — Other Ambulatory Visit: Payer: Self-pay

## 2017-08-05 ENCOUNTER — Emergency Department (HOSPITAL_COMMUNITY)
Admission: EM | Admit: 2017-08-05 | Discharge: 2017-08-05 | Disposition: A | Discharge status: Home / Self Care | Payer: PRIVATE HEALTH INSURANCE | Attending: Emergency Medicine | Admitting: Emergency Medicine

## 2017-08-05 DIAGNOSIS — S9031XA Contusion of right foot, initial encounter: Secondary | ICD-10-CM

## 2017-08-05 NOTE — ED Provider Notes (Signed)
[facility] EMERGENCY DEPARTMENT Provider Note   CSN: [id number] Arrival date & time: 08/04/17  2350     History   Chief Complaint Chief Complaint  Patient presents with  . Foot Pain    HPI Brenda Thompson is a 32 y.o. female.  The history is provided by the patient.  She has history of attention deficit disorder and solitary kidney and comes in having injured her right foot at work.  She accidentally rolled a stretcher over her right foot.  She is complaining of pain in the lateral aspect of the foot.  Pain is rated at 4/10.  It is worse if she tries to dorsiflex her foot.  She denies other injury.  Past Medical History:  Diagnosis Date  . ADHD (attention deficit hyperactivity disorder)   . 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   Past Surgical History:  Procedure Laterality Date  . TONSILLECTOMY       OB History    Gravida      Para      Term      Preterm      AB      Living  0     SAB      TAB      Ectopic      Multiple      Live Births               Home Medications    Prior to Admission medications   Medication Sig Start Date End Date Taking? Authorizing Provider  amphetamine-dextroamphetamine (ADDERALL) 10 MG tablet Take 10 mg by mouth 2 (two) times daily with a meal.    [provider]  Cyanocobalamin (B-12 COMPLIANCE INJECTION) 1000 MCG/ML KIT Inject 1,000 mcg as directed once a week.    [provider]  Norgestimate-Ethinyl Estradiol Triphasic 0.18/0.215/0.25 MG-25 MCG tab Take 1 tablet by mouth daily.    [provider]    Family History Family History  Problem Relation Age of Onset  . Pancreatic cancer Father     Social History Social History   Tobacco Use  . Smoking status: Current Some Day Smoker  . Smokeless tobacco: Never Used  Substance Use Topics  . Alcohol use: Yes    Comment: 2 beers per month  . Drug  use: No     Allergies   Biaxin [clarithromycin]; Penicillins; and Zyrtec [cetirizine]   Review of Systems Review of Systems  All other systems reviewed and are negative.    Physical Exam Updated Vital Signs BP (!) 126/103   Pulse 95   Temp 98.2 F (36.8 C) (Oral)   Resp 20   LMP 07/15/2017   SpO2 100%   Physical Exam  Nursing note and vitals reviewed.  32 year old female, resting comfortably and in no acute distress. Vital signs are significant for elevated diastolic blood pressure. Oxygen saturation is 100%, which is normal. Head is normocephalic and atraumatic. PERRLA, EOMI. Oropharynx is clear. Neck is nontender and supple without adenopathy or JVD. Back is nontender and there is no CVA tenderness. Lungs are clear without rales, wheezes, or rhonchi. Chest is nontender. Heart has regular rate and rhythm without murmur. Abdomen is soft, flat, nontender without masses or hepatosplenomegaly and peristalsis is normoactive. Extremities: Mild soft tissue swelling is present over the lateral aspect of the right midfoot with tenderness in the  same area.  Distal neurovascular exam is intact with normal sensation and prompt capillary refill.. Skin is warm and dry without rash. Neurologic: Mental status is normal, cranial nerves are intact, there are no motor or sensory deficits.  ED Treatments / Results   Radiology Dg Foot Complete Right  Result Date: 08/05/2017 CLINICAL DATA:  Pain and bruising EXAM: RIGHT FOOT COMPLETE - 3+ VIEW COMPARISON:  None. FINDINGS: There is no evidence of fracture or dislocation. There is no evidence of arthropathy or other focal bone abnormality. Soft tissues are unremarkable. IMPRESSION: Negative. Electronically Signed   By: Donavan Foil M.D.   On: 08/05/2017 01:05    Procedures Procedures   Medications Ordered in ED Medications - No data to display   Initial Impression / Assessment and Plan / ED Course  I have reviewed the triage vital  signs and the nursing notes.  Pertinent imaging results that were available during my care of the patient were reviewed by me and considered in my medical decision making (see chart for details).  Contusion of the right foot.  X-rays are negative for fracture.  She is advised on ice and elevation and advised to use over-the-counter analgesics as needed for pain.  Final Clinical Impressions(s) / ED Diagnoses   Final diagnoses:  Contusion of right foot, initial encounter    ED Discharge Orders    None       Delora Fuel, MD 88/73/73 [phone]

## 2017-08-05 NOTE — Discharge Instructions (Signed)
Apply ice several times a day. Take acetaminophen, ibuprofen, or naproxen as needed for pain. °

## 2017-08-05 NOTE — ED Triage Notes (Addendum)
Pt rolled a stretcher over her right foot while at work this am, continues to have pain, bruising noted to foot area,

## 2017-08-05 NOTE — ED Notes (Signed)
Pt back from x-ray.

## 2017-08-09 MED FILL — FLUTICASONE PROP 50 MCG SPR: 50 | 30 days supply | Qty: 16 | Fill #0

## 2017-08-16 DIAGNOSIS — E538 Deficiency of other specified B group vitamins: Secondary | ICD-10-CM | POA: Diagnosis not present

## 2017-08-23 DIAGNOSIS — E538 Deficiency of other specified B group vitamins: Secondary | ICD-10-CM | POA: Diagnosis not present

## 2017-09-06 DIAGNOSIS — E538 Deficiency of other specified B group vitamins: Secondary | ICD-10-CM | POA: Diagnosis not present

## 2017-09-11 MED FILL — AMPHETAMINE-DEXTROAMPHETAMI: 10 | 30 days supply | Qty: 60 | Fill #0

## 2017-09-20 DIAGNOSIS — E538 Deficiency of other specified B group vitamins: Secondary | ICD-10-CM | POA: Diagnosis not present

## 2017-10-31 DIAGNOSIS — R5383 Other fatigue: Secondary | ICD-10-CM | POA: Diagnosis not present

## 2017-10-31 DIAGNOSIS — F419 Anxiety disorder, unspecified: Secondary | ICD-10-CM | POA: Diagnosis not present

## 2017-10-31 DIAGNOSIS — F329 Major depressive disorder, single episode, unspecified: Secondary | ICD-10-CM | POA: Diagnosis not present

## 2017-10-31 DIAGNOSIS — G4700 Insomnia, unspecified: Secondary | ICD-10-CM | POA: Diagnosis not present

## 2017-10-31 DIAGNOSIS — E538 Deficiency of other specified B group vitamins: Secondary | ICD-10-CM | POA: Diagnosis not present

## 2017-10-31 DIAGNOSIS — F909 Attention-deficit hyperactivity disorder, unspecified type: Secondary | ICD-10-CM | POA: Diagnosis not present

## 2017-10-31 DIAGNOSIS — R7309 Other abnormal glucose: Secondary | ICD-10-CM | POA: Diagnosis not present

## 2017-10-31 DIAGNOSIS — E559 Vitamin D deficiency, unspecified: Secondary | ICD-10-CM | POA: Diagnosis not present

## 2017-10-31 DIAGNOSIS — Z5181 Encounter for therapeutic drug level monitoring: Secondary | ICD-10-CM | POA: Diagnosis not present

## 2017-10-31 DIAGNOSIS — M4134 Thoracogenic scoliosis, thoracic region: Secondary | ICD-10-CM | POA: Diagnosis not present

## 2017-10-31 DIAGNOSIS — E782 Mixed hyperlipidemia: Secondary | ICD-10-CM | POA: Diagnosis not present

## 2017-11-05 MED FILL — AMPHETAMINE-DEXTROAMPHETAMI: 10 | 30 days supply | Qty: 60 | Fill #0

## 2017-11-20 DIAGNOSIS — Z881 Allergy status to other antibiotic agents status: Secondary | ICD-10-CM | POA: Diagnosis not present

## 2017-11-20 DIAGNOSIS — Z880 Allergy status to penicillin: Secondary | ICD-10-CM | POA: Diagnosis not present

## 2017-11-20 DIAGNOSIS — M545 Low back pain: Secondary | ICD-10-CM | POA: Diagnosis not present

## 2017-11-20 DIAGNOSIS — R52 Pain, unspecified: Secondary | ICD-10-CM | POA: Diagnosis not present

## 2017-11-20 DIAGNOSIS — Z888 Allergy status to other drugs, medicaments and biological substances status: Secondary | ICD-10-CM | POA: Diagnosis not present

## 2017-11-20 DIAGNOSIS — M5489 Other dorsalgia: Secondary | ICD-10-CM | POA: Diagnosis not present

## 2017-11-28 DIAGNOSIS — S139XXA Sprain of joints and ligaments of unspecified parts of neck, initial encounter: Secondary | ICD-10-CM | POA: Diagnosis not present

## 2017-11-28 DIAGNOSIS — M545 Low back pain: Secondary | ICD-10-CM | POA: Diagnosis not present

## 2017-11-28 DIAGNOSIS — S338XXA Sprain of other parts of lumbar spine and pelvis, initial encounter: Secondary | ICD-10-CM | POA: Diagnosis not present

## 2017-11-28 DIAGNOSIS — M542 Cervicalgia: Secondary | ICD-10-CM | POA: Diagnosis not present

## 2017-12-12 MED FILL — AMPHETAMINE-DEXTROAMPHETAMI: 10 | 30 days supply | Qty: 60 | Fill #0

## 2017-12-19 DIAGNOSIS — J029 Acute pharyngitis, unspecified: Secondary | ICD-10-CM | POA: Diagnosis not present

## 2017-12-19 DIAGNOSIS — J329 Chronic sinusitis, unspecified: Secondary | ICD-10-CM | POA: Diagnosis not present

## 2017-12-26 DIAGNOSIS — F909 Attention-deficit hyperactivity disorder, unspecified type: Secondary | ICD-10-CM | POA: Diagnosis not present

## 2017-12-26 DIAGNOSIS — M545 Low back pain: Secondary | ICD-10-CM | POA: Diagnosis not present

## 2017-12-26 DIAGNOSIS — G4700 Insomnia, unspecified: Secondary | ICD-10-CM | POA: Diagnosis not present

## 2017-12-26 DIAGNOSIS — F329 Major depressive disorder, single episode, unspecified: Secondary | ICD-10-CM | POA: Diagnosis not present

## 2017-12-26 DIAGNOSIS — F419 Anxiety disorder, unspecified: Secondary | ICD-10-CM | POA: Diagnosis not present

## 2018-01-21 MED FILL — AMPHETAMINE-DEXTROAMPHETAMI: 10 | 30 days supply | Qty: 60 | Fill #0

## 2018-02-27 DIAGNOSIS — F909 Attention-deficit hyperactivity disorder, unspecified type: Secondary | ICD-10-CM | POA: Diagnosis not present

## 2018-02-27 DIAGNOSIS — F419 Anxiety disorder, unspecified: Secondary | ICD-10-CM | POA: Diagnosis not present

## 2018-03-04 MED FILL — AMPHETAMINE-DEXTROAMPHETAMI: 10 | 30 days supply | Qty: 60 | Fill #0

## 2018-03-12 DIAGNOSIS — J069 Acute upper respiratory infection, unspecified: Secondary | ICD-10-CM | POA: Diagnosis not present

## 2018-03-26 DIAGNOSIS — F419 Anxiety disorder, unspecified: Secondary | ICD-10-CM | POA: Diagnosis not present

## 2018-03-26 DIAGNOSIS — F909 Attention-deficit hyperactivity disorder, unspecified type: Secondary | ICD-10-CM | POA: Diagnosis not present

## 2018-04-12 DIAGNOSIS — H66002 Acute suppurative otitis media without spontaneous rupture of ear drum, left ear: Secondary | ICD-10-CM | POA: Diagnosis not present

## 2018-04-12 DIAGNOSIS — Z20828 Contact with and (suspected) exposure to other viral communicable diseases: Secondary | ICD-10-CM | POA: Diagnosis not present

## 2018-04-12 DIAGNOSIS — J069 Acute upper respiratory infection, unspecified: Secondary | ICD-10-CM | POA: Diagnosis not present

## 2018-05-23 DIAGNOSIS — Z01419 Encounter for gynecological examination (general) (routine) without abnormal findings: Secondary | ICD-10-CM | POA: Diagnosis not present

## 2018-06-24 DIAGNOSIS — L400 Psoriasis vulgaris: Secondary | ICD-10-CM | POA: Diagnosis not present

## 2018-07-01 MED FILL — AMPHETAMINE-DEXTROAMPHETAMI: 10 | 30 days supply | Qty: 60 | Fill #0

## 2018-07-04 DIAGNOSIS — F331 Major depressive disorder, recurrent, moderate: Secondary | ICD-10-CM | POA: Diagnosis not present

## 2018-07-04 DIAGNOSIS — Z719 Counseling, unspecified: Secondary | ICD-10-CM | POA: Diagnosis not present

## 2018-07-04 DIAGNOSIS — F909 Attention-deficit hyperactivity disorder, unspecified type: Secondary | ICD-10-CM | POA: Diagnosis not present

## 2018-07-04 DIAGNOSIS — F419 Anxiety disorder, unspecified: Secondary | ICD-10-CM | POA: Diagnosis not present

## 2018-07-19 MED FILL — TRINTELLIX 10 MG TABLET: 10 | 30 days supply | Qty: 30 | Fill #0

## 2018-08-19 MED FILL — AMPHETAMINE-DEXTROAMPHETAMI: 10 | 30 days supply | Qty: 60 | Fill #0

## 2018-08-22 DIAGNOSIS — L400 Psoriasis vulgaris: Secondary | ICD-10-CM | POA: Diagnosis not present

## 2018-10-07 DIAGNOSIS — Z1329 Encounter for screening for other suspected endocrine disorder: Secondary | ICD-10-CM | POA: Diagnosis not present

## 2018-10-07 DIAGNOSIS — F419 Anxiety disorder, unspecified: Secondary | ICD-10-CM | POA: Diagnosis not present

## 2018-10-07 DIAGNOSIS — E559 Vitamin D deficiency, unspecified: Secondary | ICD-10-CM | POA: Diagnosis not present

## 2018-10-07 DIAGNOSIS — E538 Deficiency of other specified B group vitamins: Secondary | ICD-10-CM | POA: Diagnosis not present

## 2018-10-07 DIAGNOSIS — Z13220 Encounter for screening for lipoid disorders: Secondary | ICD-10-CM | POA: Diagnosis not present

## 2018-10-07 DIAGNOSIS — F909 Attention-deficit hyperactivity disorder, unspecified type: Secondary | ICD-10-CM | POA: Diagnosis not present

## 2018-10-07 DIAGNOSIS — Z13228 Encounter for screening for other metabolic disorders: Secondary | ICD-10-CM | POA: Diagnosis not present

## 2018-10-07 DIAGNOSIS — Z131 Encounter for screening for diabetes mellitus: Secondary | ICD-10-CM | POA: Diagnosis not present

## 2018-10-07 DIAGNOSIS — F331 Major depressive disorder, recurrent, moderate: Secondary | ICD-10-CM | POA: Diagnosis not present

## 2018-10-24 MED FILL — AMPHETAMINE-DEXTROAMPHETAMI: 10 | 30 days supply | Qty: 60 | Fill #0

## 2018-12-16 MED FILL — AMPHETAMINE-DEXTROAMPHETAMI: 10 | 30 days supply | Qty: 60 | Fill #0

## 2019-01-06 DIAGNOSIS — E538 Deficiency of other specified B group vitamins: Secondary | ICD-10-CM | POA: Diagnosis not present

## 2019-01-06 DIAGNOSIS — F909 Attention-deficit hyperactivity disorder, unspecified type: Secondary | ICD-10-CM | POA: Diagnosis not present

## 2019-01-06 DIAGNOSIS — F419 Anxiety disorder, unspecified: Secondary | ICD-10-CM | POA: Diagnosis not present

## 2019-01-06 DIAGNOSIS — E559 Vitamin D deficiency, unspecified: Secondary | ICD-10-CM | POA: Diagnosis not present

## 2019-01-08 DIAGNOSIS — E559 Vitamin D deficiency, unspecified: Secondary | ICD-10-CM | POA: Diagnosis not present

## 2019-01-08 DIAGNOSIS — E538 Deficiency of other specified B group vitamins: Secondary | ICD-10-CM | POA: Diagnosis not present

## 2019-01-08 DIAGNOSIS — R7989 Other specified abnormal findings of blood chemistry: Secondary | ICD-10-CM | POA: Diagnosis not present

## 2019-03-05 DIAGNOSIS — L309 Dermatitis, unspecified: Secondary | ICD-10-CM | POA: Diagnosis not present

## 2019-03-05 DIAGNOSIS — L400 Psoriasis vulgaris: Secondary | ICD-10-CM | POA: Diagnosis not present

## 2019-03-05 DIAGNOSIS — L700 Acne vulgaris: Secondary | ICD-10-CM | POA: Diagnosis not present

## 2019-03-27 MED FILL — AMPHETAMINE-DEXTROAMPHETAMI: 10 | 30 days supply | Qty: 60 | Fill #0

## 2019-04-21 DIAGNOSIS — E538 Deficiency of other specified B group vitamins: Secondary | ICD-10-CM | POA: Diagnosis not present

## 2019-04-21 DIAGNOSIS — E559 Vitamin D deficiency, unspecified: Secondary | ICD-10-CM | POA: Diagnosis not present

## 2019-04-21 DIAGNOSIS — F419 Anxiety disorder, unspecified: Secondary | ICD-10-CM | POA: Diagnosis not present

## 2019-04-21 DIAGNOSIS — Z719 Counseling, unspecified: Secondary | ICD-10-CM | POA: Diagnosis not present

## 2019-05-28 DIAGNOSIS — E663 Overweight: Secondary | ICD-10-CM | POA: Diagnosis not present

## 2019-05-28 DIAGNOSIS — Z719 Counseling, unspecified: Secondary | ICD-10-CM | POA: Diagnosis not present

## 2019-05-28 DIAGNOSIS — Z01419 Encounter for gynecological examination (general) (routine) without abnormal findings: Secondary | ICD-10-CM | POA: Diagnosis not present

## 2019-05-28 DIAGNOSIS — Z9229 Personal history of other drug therapy: Secondary | ICD-10-CM | POA: Diagnosis not present

## 2019-05-28 DIAGNOSIS — Z202 Contact with and (suspected) exposure to infections with a predominantly sexual mode of transmission: Secondary | ICD-10-CM | POA: Diagnosis not present

## 2019-07-15 IMAGING — DX DG FOOT COMPLETE 3+V*R*
3 series · 3 of 3 positions shown · non-contrast
Comparison: None.

CLINICAL DATA: Pain and bruising

EXAM:
RIGHT FOOT COMPLETE - 3+ VIEW

[foot ap]
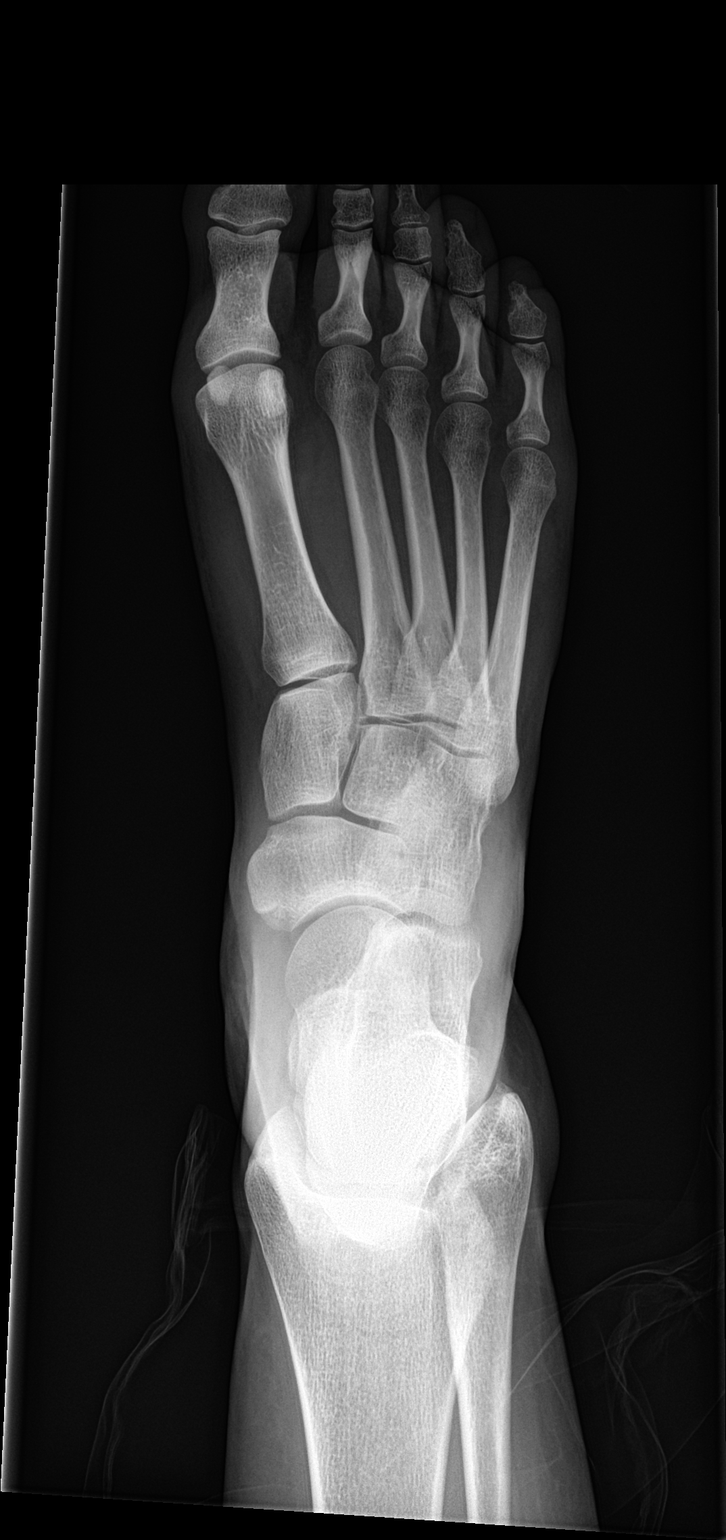

[foot obl]
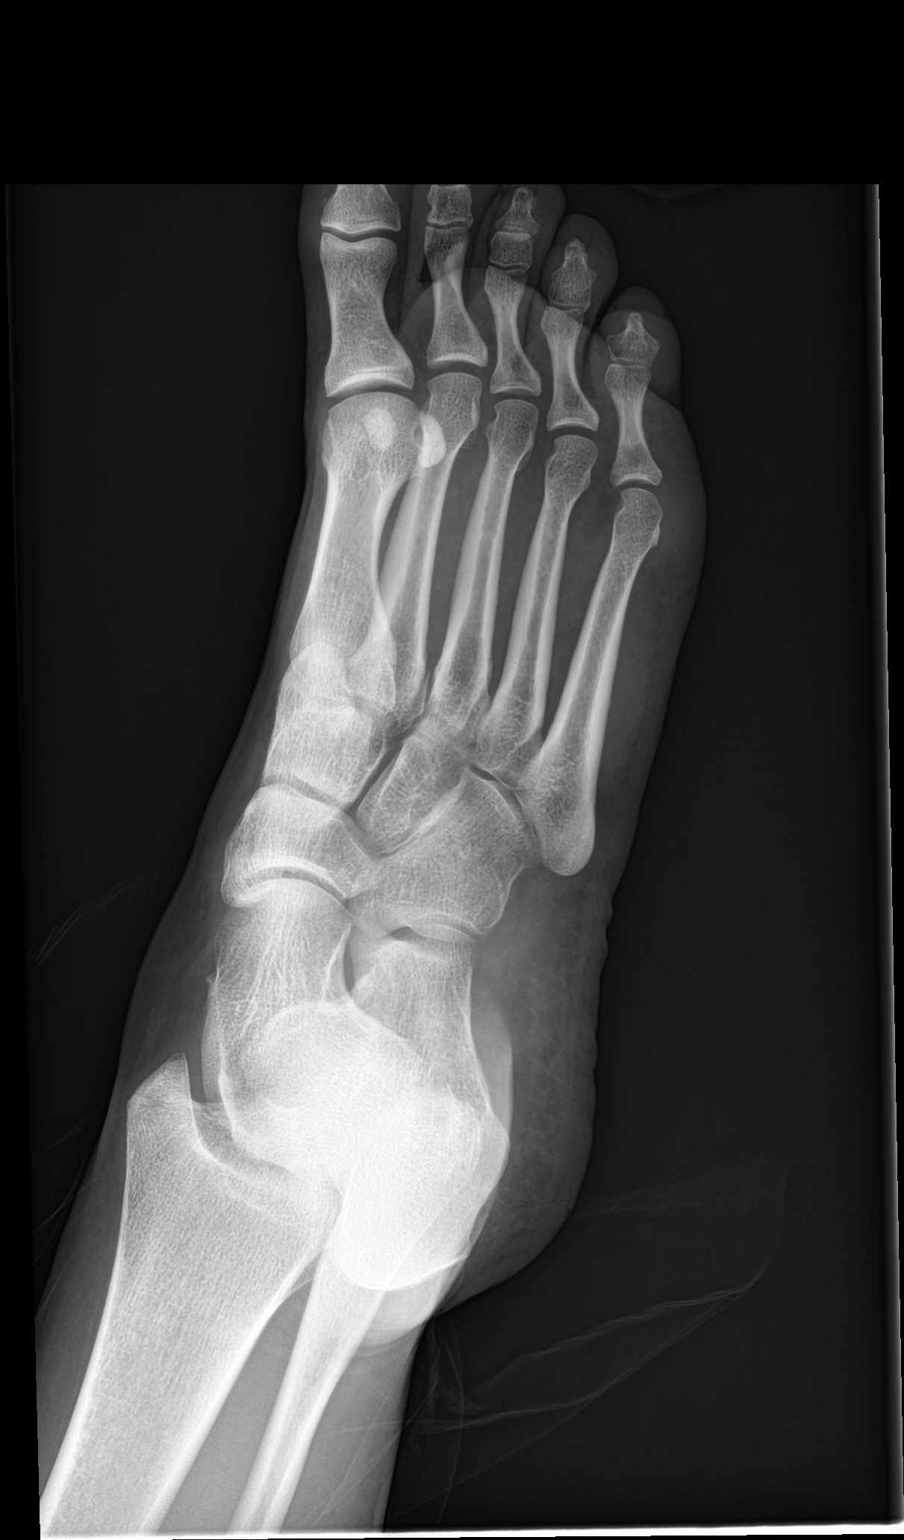

[foot lat]
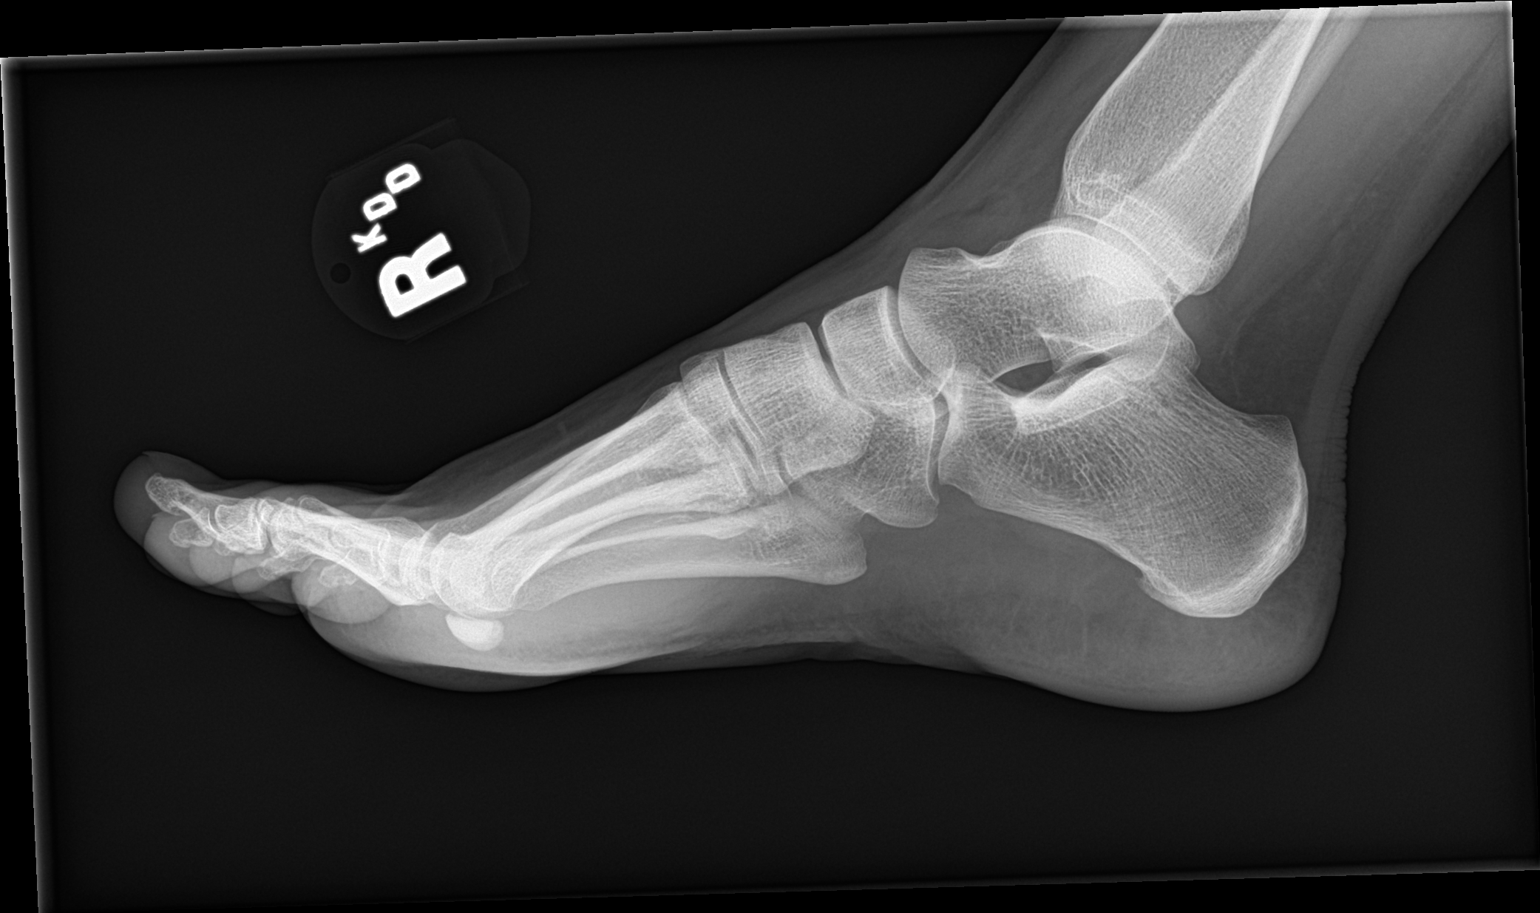

[3 of 3 positions shown; findings below may reference images not displayed]

FINDINGS: There is no evidence of fracture or dislocation. There is no
evidence of arthropathy or other focal bone abnormality. Soft
tissues are unremarkable.
IMPRESSION: Negative.

## 2019-08-01 DIAGNOSIS — L400 Psoriasis vulgaris: Secondary | ICD-10-CM | POA: Diagnosis not present

## 2019-08-15 DIAGNOSIS — F909 Attention-deficit hyperactivity disorder, unspecified type: Secondary | ICD-10-CM | POA: Diagnosis not present

## 2019-08-15 DIAGNOSIS — Z9229 Personal history of other drug therapy: Secondary | ICD-10-CM | POA: Diagnosis not present

## 2019-08-15 DIAGNOSIS — Z719 Counseling, unspecified: Secondary | ICD-10-CM | POA: Diagnosis not present

## 2019-08-15 DIAGNOSIS — F419 Anxiety disorder, unspecified: Secondary | ICD-10-CM | POA: Diagnosis not present

## 2019-11-13 DIAGNOSIS — F419 Anxiety disorder, unspecified: Secondary | ICD-10-CM | POA: Diagnosis not present

## 2019-11-13 DIAGNOSIS — R7309 Other abnormal glucose: Secondary | ICD-10-CM | POA: Diagnosis not present

## 2019-11-13 DIAGNOSIS — F909 Attention-deficit hyperactivity disorder, unspecified type: Secondary | ICD-10-CM | POA: Diagnosis not present

## 2019-11-13 DIAGNOSIS — Z9229 Personal history of other drug therapy: Secondary | ICD-10-CM | POA: Diagnosis not present

## 2019-11-13 DIAGNOSIS — Z719 Counseling, unspecified: Secondary | ICD-10-CM | POA: Diagnosis not present

## 2020-02-04 DIAGNOSIS — E559 Vitamin D deficiency, unspecified: Secondary | ICD-10-CM | POA: Diagnosis not present

## 2020-02-04 DIAGNOSIS — F909 Attention-deficit hyperactivity disorder, unspecified type: Secondary | ICD-10-CM | POA: Diagnosis not present

## 2020-02-04 DIAGNOSIS — Z1329 Encounter for screening for other suspected endocrine disorder: Secondary | ICD-10-CM | POA: Diagnosis not present

## 2020-02-04 DIAGNOSIS — E538 Deficiency of other specified B group vitamins: Secondary | ICD-10-CM | POA: Diagnosis not present

## 2020-02-04 DIAGNOSIS — R7989 Other specified abnormal findings of blood chemistry: Secondary | ICD-10-CM | POA: Diagnosis not present

## 2020-02-04 DIAGNOSIS — F419 Anxiety disorder, unspecified: Secondary | ICD-10-CM | POA: Diagnosis not present

## 2020-02-04 DIAGNOSIS — Z719 Counseling, unspecified: Secondary | ICD-10-CM | POA: Diagnosis not present

## 2020-02-04 DIAGNOSIS — Z9229 Personal history of other drug therapy: Secondary | ICD-10-CM | POA: Diagnosis not present

## 2020-02-26 DIAGNOSIS — Z20822 Contact with and (suspected) exposure to covid-19: Secondary | ICD-10-CM | POA: Diagnosis not present

## 2020-02-26 DIAGNOSIS — H6692 Otitis media, unspecified, left ear: Secondary | ICD-10-CM | POA: Diagnosis not present

## 2020-03-17 DIAGNOSIS — L309 Dermatitis, unspecified: Secondary | ICD-10-CM | POA: Diagnosis not present

## 2020-03-17 DIAGNOSIS — L400 Psoriasis vulgaris: Secondary | ICD-10-CM | POA: Diagnosis not present

## 2020-05-05 ENCOUNTER — Other Ambulatory Visit (HOSPITAL_COMMUNITY): Payer: Self-pay | Admitting: Specialist

## 2020-05-05 DIAGNOSIS — Z1231 Encounter for screening mammogram for malignant neoplasm of breast: Secondary | ICD-10-CM

## 2020-05-06 DIAGNOSIS — Q602 Renal agenesis, unspecified: Secondary | ICD-10-CM | POA: Diagnosis not present

## 2020-05-06 DIAGNOSIS — E538 Deficiency of other specified B group vitamins: Secondary | ICD-10-CM | POA: Diagnosis not present

## 2020-05-06 DIAGNOSIS — Z0000 Encounter for general adult medical examination without abnormal findings: Secondary | ICD-10-CM | POA: Diagnosis not present

## 2020-05-06 DIAGNOSIS — R739 Hyperglycemia, unspecified: Secondary | ICD-10-CM | POA: Diagnosis not present

## 2020-05-06 DIAGNOSIS — E559 Vitamin D deficiency, unspecified: Secondary | ICD-10-CM | POA: Diagnosis not present

## 2020-05-06 DIAGNOSIS — F419 Anxiety disorder, unspecified: Secondary | ICD-10-CM | POA: Diagnosis not present

## 2020-05-06 DIAGNOSIS — F900 Attention-deficit hyperactivity disorder, predominantly inattentive type: Secondary | ICD-10-CM | POA: Diagnosis not present

## 2020-06-01 DIAGNOSIS — R944 Abnormal results of kidney function studies: Secondary | ICD-10-CM | POA: Diagnosis not present

## 2020-06-01 DIAGNOSIS — Z0000 Encounter for general adult medical examination without abnormal findings: Secondary | ICD-10-CM | POA: Diagnosis not present

## 2020-08-06 DIAGNOSIS — Z719 Counseling, unspecified: Secondary | ICD-10-CM | POA: Diagnosis not present

## 2020-08-06 DIAGNOSIS — Z01419 Encounter for gynecological examination (general) (routine) without abnormal findings: Secondary | ICD-10-CM | POA: Diagnosis not present

## 2020-08-06 DIAGNOSIS — E663 Overweight: Secondary | ICD-10-CM | POA: Diagnosis not present

## 2020-08-06 DIAGNOSIS — F909 Attention-deficit hyperactivity disorder, unspecified type: Secondary | ICD-10-CM | POA: Diagnosis not present

## 2020-08-11 DIAGNOSIS — E559 Vitamin D deficiency, unspecified: Secondary | ICD-10-CM | POA: Diagnosis not present

## 2020-08-11 DIAGNOSIS — F900 Attention-deficit hyperactivity disorder, predominantly inattentive type: Secondary | ICD-10-CM | POA: Diagnosis not present

## 2020-08-11 DIAGNOSIS — F419 Anxiety disorder, unspecified: Secondary | ICD-10-CM | POA: Diagnosis not present

## 2020-08-11 DIAGNOSIS — E538 Deficiency of other specified B group vitamins: Secondary | ICD-10-CM | POA: Diagnosis not present

## 2020-08-13 DIAGNOSIS — Z20822 Contact with and (suspected) exposure to covid-19: Secondary | ICD-10-CM | POA: Diagnosis not present

## 2020-08-14 ENCOUNTER — Other Ambulatory Visit: Payer: Self-pay

## 2020-08-14 ENCOUNTER — Ambulatory Visit
Admission: EM | Admit: 2020-08-14 | Discharge: 2020-08-14 | Disposition: A | Discharge status: Home / Self Care | Payer: 59

## 2020-08-14 ENCOUNTER — Encounter: Payer: Self-pay | Admitting: Emergency Medicine

## 2020-08-14 DIAGNOSIS — U071 COVID-19: Secondary | ICD-10-CM

## 2020-08-14 DIAGNOSIS — R059 Cough, unspecified: Secondary | ICD-10-CM

## 2020-08-14 MED ORDER — ALBUTEROL SULFATE HFA 108 (90 BASE) MCG/ACT IN AERS: 1.0000 | INHALATION_SPRAY | Freq: Four times a day (QID) | RESPIRATORY_TRACT | 0 refills | Status: AC | PRN

## 2020-08-14 MED ORDER — PROMETHAZINE-DM 6.25-15 MG/5ML PO SYRP
5.0000 mL | ORAL_SOLUTION | Freq: Four times a day (QID) | ORAL | 0 refills | Status: AC | PRN
Start: 2020-08-14 — End: ?

## 2020-08-14 NOTE — Discharge Instructions (Addendum)
Take Flonase and Mucinex D Can take ibuprofen as needed Take Cough syrup as needed for cough Use albuterol inhaler if needed for shortness of breath, chest tightness.  Push fluids, rest

## 2020-08-14 NOTE — ED Triage Notes (Signed)
Positive covid test on Thursday.  States she feels worse since yesterday, cough, nasal congestion.

## 2020-11-10 DIAGNOSIS — Z905 Acquired absence of kidney: Secondary | ICD-10-CM | POA: Diagnosis not present

## 2020-11-10 DIAGNOSIS — E538 Deficiency of other specified B group vitamins: Secondary | ICD-10-CM | POA: Diagnosis not present

## 2020-11-10 DIAGNOSIS — F419 Anxiety disorder, unspecified: Secondary | ICD-10-CM | POA: Diagnosis not present

## 2020-11-10 DIAGNOSIS — E559 Vitamin D deficiency, unspecified: Secondary | ICD-10-CM | POA: Diagnosis not present

## 2020-11-10 DIAGNOSIS — F900 Attention-deficit hyperactivity disorder, predominantly inattentive type: Secondary | ICD-10-CM | POA: Diagnosis not present

## 2021-02-10 DIAGNOSIS — F900 Attention-deficit hyperactivity disorder, predominantly inattentive type: Secondary | ICD-10-CM | POA: Diagnosis not present

## 2021-02-10 DIAGNOSIS — E559 Vitamin D deficiency, unspecified: Secondary | ICD-10-CM | POA: Diagnosis not present

## 2021-02-10 DIAGNOSIS — E538 Deficiency of other specified B group vitamins: Secondary | ICD-10-CM | POA: Diagnosis not present

## 2021-02-10 DIAGNOSIS — F419 Anxiety disorder, unspecified: Secondary | ICD-10-CM | POA: Diagnosis not present

## 2021-03-02 DIAGNOSIS — S8002XA Contusion of left knee, initial encounter: Secondary | ICD-10-CM | POA: Diagnosis not present

## 2021-03-02 DIAGNOSIS — M2392 Unspecified internal derangement of left knee: Secondary | ICD-10-CM | POA: Diagnosis not present

## 2021-05-11 DIAGNOSIS — Z905 Acquired absence of kidney: Secondary | ICD-10-CM | POA: Diagnosis not present

## 2021-05-11 DIAGNOSIS — E538 Deficiency of other specified B group vitamins: Secondary | ICD-10-CM | POA: Diagnosis not present

## 2021-05-11 DIAGNOSIS — H1013 Acute atopic conjunctivitis, bilateral: Secondary | ICD-10-CM | POA: Diagnosis not present

## 2021-05-11 DIAGNOSIS — E559 Vitamin D deficiency, unspecified: Secondary | ICD-10-CM | POA: Diagnosis not present

## 2021-05-11 DIAGNOSIS — F900 Attention-deficit hyperactivity disorder, predominantly inattentive type: Secondary | ICD-10-CM | POA: Diagnosis not present

## 2021-05-11 DIAGNOSIS — F419 Anxiety disorder, unspecified: Secondary | ICD-10-CM | POA: Diagnosis not present

## 2021-05-11 DIAGNOSIS — Z0000 Encounter for general adult medical examination without abnormal findings: Secondary | ICD-10-CM | POA: Diagnosis not present

## 2021-08-12 DIAGNOSIS — Z1231 Encounter for screening mammogram for malignant neoplasm of breast: Secondary | ICD-10-CM | POA: Diagnosis not present

## 2021-08-12 DIAGNOSIS — Z719 Counseling, unspecified: Secondary | ICD-10-CM | POA: Diagnosis not present

## 2021-08-12 DIAGNOSIS — E663 Overweight: Secondary | ICD-10-CM | POA: Diagnosis not present

## 2021-08-12 DIAGNOSIS — Z01419 Encounter for gynecological examination (general) (routine) without abnormal findings: Secondary | ICD-10-CM | POA: Diagnosis not present

## 2021-11-23 DIAGNOSIS — L400 Psoriasis vulgaris: Secondary | ICD-10-CM | POA: Diagnosis not present

## 2021-11-23 DIAGNOSIS — L308 Other specified dermatitis: Secondary | ICD-10-CM | POA: Diagnosis not present

## 2022-02-01 DIAGNOSIS — E559 Vitamin D deficiency, unspecified: Secondary | ICD-10-CM | POA: Diagnosis not present

## 2022-02-01 DIAGNOSIS — E538 Deficiency of other specified B group vitamins: Secondary | ICD-10-CM | POA: Diagnosis not present

## 2022-02-01 DIAGNOSIS — Z905 Acquired absence of kidney: Secondary | ICD-10-CM | POA: Diagnosis not present

## 2022-08-25 DIAGNOSIS — E663 Overweight: Secondary | ICD-10-CM | POA: Diagnosis not present

## 2022-08-25 DIAGNOSIS — Z8742 Personal history of other diseases of the female genital tract: Secondary | ICD-10-CM | POA: Diagnosis not present

## 2022-08-25 DIAGNOSIS — Z719 Counseling, unspecified: Secondary | ICD-10-CM | POA: Diagnosis not present

## 2022-08-25 DIAGNOSIS — Z01419 Encounter for gynecological examination (general) (routine) without abnormal findings: Secondary | ICD-10-CM | POA: Diagnosis not present

## 2022-10-26 DIAGNOSIS — Z905 Acquired absence of kidney: Secondary | ICD-10-CM | POA: Diagnosis not present

## 2022-10-26 DIAGNOSIS — E559 Vitamin D deficiency, unspecified: Secondary | ICD-10-CM | POA: Diagnosis not present

## 2022-10-26 DIAGNOSIS — R61 Generalized hyperhidrosis: Secondary | ICD-10-CM | POA: Diagnosis not present

## 2022-10-26 DIAGNOSIS — E538 Deficiency of other specified B group vitamins: Secondary | ICD-10-CM | POA: Diagnosis not present

## 2022-11-29 DIAGNOSIS — L400 Psoriasis vulgaris: Secondary | ICD-10-CM | POA: Diagnosis not present

## 2022-11-29 DIAGNOSIS — L308 Other specified dermatitis: Secondary | ICD-10-CM | POA: Diagnosis not present

## 2022-11-29 DIAGNOSIS — L700 Acne vulgaris: Secondary | ICD-10-CM | POA: Diagnosis not present

## 2023-08-10 DIAGNOSIS — E559 Vitamin D deficiency, unspecified: Secondary | ICD-10-CM | POA: Diagnosis not present

## 2023-08-10 DIAGNOSIS — F4320 Adjustment disorder, unspecified: Secondary | ICD-10-CM | POA: Diagnosis not present

## 2023-08-10 DIAGNOSIS — E669 Obesity, unspecified: Secondary | ICD-10-CM | POA: Diagnosis not present

## 2023-08-10 DIAGNOSIS — R7303 Prediabetes: Secondary | ICD-10-CM | POA: Diagnosis not present

## 2023-08-10 DIAGNOSIS — Z0000 Encounter for general adult medical examination without abnormal findings: Secondary | ICD-10-CM | POA: Diagnosis not present

## 2023-08-10 DIAGNOSIS — F419 Anxiety disorder, unspecified: Secondary | ICD-10-CM | POA: Diagnosis not present

## 2023-08-10 DIAGNOSIS — Z905 Acquired absence of kidney: Secondary | ICD-10-CM | POA: Diagnosis not present

## 2023-08-10 DIAGNOSIS — F909 Attention-deficit hyperactivity disorder, unspecified type: Secondary | ICD-10-CM | POA: Diagnosis not present

## 2023-08-10 DIAGNOSIS — E538 Deficiency of other specified B group vitamins: Secondary | ICD-10-CM | POA: Diagnosis not present

## 2023-08-10 DIAGNOSIS — L409 Psoriasis, unspecified: Secondary | ICD-10-CM | POA: Diagnosis not present

## 2023-08-14 DIAGNOSIS — E559 Vitamin D deficiency, unspecified: Secondary | ICD-10-CM | POA: Diagnosis not present

## 2023-08-14 DIAGNOSIS — E538 Deficiency of other specified B group vitamins: Secondary | ICD-10-CM | POA: Diagnosis not present

## 2023-08-14 DIAGNOSIS — R7303 Prediabetes: Secondary | ICD-10-CM | POA: Diagnosis not present

## 2023-08-30 DIAGNOSIS — Z719 Counseling, unspecified: Secondary | ICD-10-CM | POA: Diagnosis not present

## 2023-08-30 DIAGNOSIS — R87610 Atypical squamous cells of undetermined significance on cytologic smear of cervix (ASC-US): Secondary | ICD-10-CM | POA: Diagnosis not present

## 2023-08-30 DIAGNOSIS — Z2889 Immunization not carried out for other reason: Secondary | ICD-10-CM | POA: Diagnosis not present

## 2023-08-30 DIAGNOSIS — E663 Overweight: Secondary | ICD-10-CM | POA: Diagnosis not present

## 2023-08-30 DIAGNOSIS — F4320 Adjustment disorder, unspecified: Secondary | ICD-10-CM | POA: Diagnosis not present

## 2023-08-30 DIAGNOSIS — Z1231 Encounter for screening mammogram for malignant neoplasm of breast: Secondary | ICD-10-CM | POA: Diagnosis not present

## 2023-08-30 DIAGNOSIS — Z01419 Encounter for gynecological examination (general) (routine) without abnormal findings: Secondary | ICD-10-CM | POA: Diagnosis not present

## 2023-08-30 DIAGNOSIS — N926 Irregular menstruation, unspecified: Secondary | ICD-10-CM | POA: Diagnosis not present

## 2023-09-26 DIAGNOSIS — R87610 Atypical squamous cells of undetermined significance on cytologic smear of cervix (ASC-US): Secondary | ICD-10-CM | POA: Diagnosis not present
